# Patient Record
Sex: Male | Born: 1975 | Race: Black or African American | Hispanic: No | Marital: Single | Smoking: Current some day smoker
Health system: Southern US, Community
[De-identification: ages and names within clinical notes are randomized; demographics above are authoritative.]

## PROBLEM LIST (undated history)

## (undated) DIAGNOSIS — K5909 Other constipation: Secondary | ICD-10-CM

## (undated) DIAGNOSIS — F200 Paranoid schizophrenia: Secondary | ICD-10-CM

## (undated) DIAGNOSIS — F251 Schizoaffective disorder, depressive type: Secondary | ICD-10-CM

## (undated) DIAGNOSIS — J449 Chronic obstructive pulmonary disease, unspecified: Secondary | ICD-10-CM

## (undated) DIAGNOSIS — F32A Depression, unspecified: Secondary | ICD-10-CM

## (undated) DIAGNOSIS — J309 Allergic rhinitis, unspecified: Secondary | ICD-10-CM

## (undated) DIAGNOSIS — F331 Major depressive disorder, recurrent, moderate: Secondary | ICD-10-CM

## (undated) DIAGNOSIS — E785 Hyperlipidemia, unspecified: Secondary | ICD-10-CM

## (undated) DIAGNOSIS — F329 Major depressive disorder, single episode, unspecified: Secondary | ICD-10-CM

## (undated) DIAGNOSIS — K449 Diaphragmatic hernia without obstruction or gangrene: Secondary | ICD-10-CM

## (undated) DIAGNOSIS — I10 Essential (primary) hypertension: Secondary | ICD-10-CM

## (undated) DIAGNOSIS — M109 Gout, unspecified: Secondary | ICD-10-CM

## (undated) HISTORY — PX: APPENDECTOMY: SHX54

---
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
## 2013-08-02 ENCOUNTER — Emergency Department (INDEPENDENT_AMBULATORY_CARE_PROVIDER_SITE_OTHER): Payer: Medicare Other

## 2013-08-02 ENCOUNTER — Emergency Department (INDEPENDENT_AMBULATORY_CARE_PROVIDER_SITE_OTHER)
Admission: EM | Admit: 2013-08-02 | Discharge: 2013-08-02 | Disposition: A | Discharge status: Home / Self Care | Payer: Medicare Other | Source: Home / Self Care | Attending: Emergency Medicine | Admitting: Emergency Medicine

## 2013-08-02 ENCOUNTER — Encounter (HOSPITAL_COMMUNITY): Payer: Self-pay | Admitting: *Deleted

## 2013-08-02 DIAGNOSIS — M1310 Monoarthritis, not elsewhere classified, unspecified site: Secondary | ICD-10-CM

## 2013-08-02 HISTORY — DX: Depression, unspecified: F32.A

## 2013-08-02 HISTORY — DX: Major depressive disorder, single episode, unspecified: F32.9

## 2013-08-02 HISTORY — DX: Chronic obstructive pulmonary disease, unspecified: J44.9

## 2013-08-02 LAB — URIC ACID: Uric Acid, Serum: 10.5 mg/dL — ABNORMAL HIGH (ref 4.0–7.8)

## 2013-08-02 LAB — SYNOVIAL CELL COUNT + DIFF, W/ CRYSTALS: Eosinophils-Synovial: 0 % (ref 0–1)

## 2013-08-02 MED ORDER — METHYLPREDNISOLONE ACETATE 40 MG/ML IJ SUSP
INTRAMUSCULAR | Status: AC
  Filled 2013-08-02: qty 5

## 2013-08-02 MED ORDER — MELOXICAM 15 MG PO TABS: 15.0000 mg | ORAL_TABLET | Freq: Every day | ORAL | Status: DC

## 2013-08-02 NOTE — ED Notes (Signed)
Patient complains of swollen and aching left knee x 3 days; states pain when stepping forward; states taking tylenol and ibuprofen and knee has a numbness to now.

## 2013-08-02 NOTE — ED Provider Notes (Signed)
Chief Complaint:   Chief Complaint  Patient presents with  . Knee Pain    History of Present Illness:   Gerald Jackson is a 37 year old male who's had a three-day history of left knee pain. This is rated 8/10 in intensity, is localized to both sides of the knee and the knee is swollen. It hurts if she walks or even if he sits and better when he takes pain medication. He denies any injury to the knee. He's had no fever or chills. No other joint pain. No history of gout in the past.  Review of Systems:  Other than noted above, the patient denies any of the following symptoms: Systemic:  No fevers, chills, sweats, or aches.  No fatigue or tiredness. Musculoskeletal:  No joint pain, arthritis, bursitis, swelling, back pain, or neck pain.  Neurological:  No muscular weakness, paresthesias, headache, or trouble with speech or coordination.  No dizziness.  PMFSH:  Past medical history, family history, social history, meds, and allergies were reviewed.  No prior history of knee pain or arthritis.  He has COPD, depression, paranoid schizophrenia, and hypercholesterolemia. He takes Cogentin, Invega, Risperdal, and Zoloft.  Physical Exam:   Vital signs:  BP 116/83  Pulse 105  Temp(Src) 98.9 F (37.2 C) (Oral)  Resp 18  SpO2 96% Gen:  Alert and oriented times 3.  In no distress. Musculoskeletal: The left knee is markedly swollen with slight erythema, heat, and tenderness to palpation. Range of motion is limited and painful.   McMurray's test was negative.  Lachman's test was negative.  Anterior drawer test was negative.   Varus and valgus stress negative.  Otherwise, all joints had a full a ROM with no swelling, bruising or deformity.  No edema, pulses full. Extremities were warm and pink.  Capillary refill was brisk.  Skin:  Clear, warm and dry.  No rash. Neuro:  Alert and oriented times 3.  Muscle strength was normal.  Sensation was intact to light touch.   Results for orders placed during the  hospital encounter of 08/02/13  URIC ACID      Result Value Range   Uric Acid, Serum 10.5 (*) 4.0 - 7.8 mg/dL  CELL COUNT + DIFF,  W/ CRYST-SYNVL FLD      Result Value Range   Color, Synovial YELLOW (*) YELLOW   Appearance-Synovial CLOUDY (*) CLEAR   Crystals, Fluid INTRACELLULAR MONOSODIUM URATE CRYSTALS     WBC, Synovial 40981 (*) 0 - 200 /cu mm   Neutrophil, Synovial 96 (*) 0 - 25 %   Lymphocytes-Synovial Fld 1  0 - 20 %   Monocyte-Macrophage-Synovial Fluid 3 (*) 50 - 90 %   Eosinophils-Synovial 0  0 - 1 %   Radiology:  Dg Knee Complete 4 Views Left  08/02/2013   *RADIOLOGY REPORT*  Clinical Data: Pain and swelling  LEFT KNEE - COMPLETE 4+ VIEW  Comparison: None.  Findings: Frontal, lateral, and bilateral oblique views were obtained.  There is a large joint effusion.  No fracture or dislocation.  Joint spaces appear intact.  There is calcification along the inferior patellar tendons consistent with chronic patellar tendinosis.  IMPRESSION: Sizable joint effusion.  Evidence of chronic patellar tendinosis inferiorly.  No fracture or dislocation.   Original Report Authenticated By: Bretta Bang, M.D.   I reviewed the images independently and personally and concur with the radiologist's findings.   Procedure Note:  Verbal informed consent was obtained from the patient.  Risks and benefits were outlined with the  patient.  Patient understands and accepts these risks.  Identity of the patient was confirmed verbally and by armband.    Procedure was performed as follows:  The lateral aspect of the knee was prepped with Betadine and alcohol and anesthetized with 2% Xylocaine. Landmarks were identified an injection site was marked. The knee joint was entered with a 1/2 inch 22-gauge needle and 55 mL of thick, cloudy fluid was aspirated. Thereafter the knee was injected with 1 cc of Depo-Medrol 40 mg strength and 2 mL of 2% Xylocaine. The patient experienced relief thereafter. He was placed in a  knee sleeve.  Patient tolerated the procedure well without any immediate complications.  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  This is diagnostic for gout. He has intracellular monosodium urate crystals an elevated uric acid. The injection of steroid should provide him with relief. He's also got some meloxicam to take as well. I urged him to followup with her primary care physician to control his uric acid level.  Plan:   1.  The following meds were prescribed:   Discharge Medication List as of 08/02/2013  4:45 PM    START taking these medications   Details  meloxicam (MOBIC) 15 MG tablet Take 1 tablet (15 mg total) by mouth daily., Starting 08/02/2013, Until Discontinued, Normal       2.  The patient was instructed in symptomatic care, including rest and activity, elevation, application of ice and compression.  Appropriate handouts were given. 3.  The patient was told to return if becoming worse in any way, if no better in 3 or 4 days, and given some red flag symptoms such as any degree of fever that would indicate earlier return.   4.  The patient was told to follow up with Dr. Maryelizabeth Rowan as soon as possible.    Reuben Likes, MD 08/02/13 2012

## 2013-08-03 NOTE — ED Notes (Signed)
Synovial fluid cell count:  WBC's 23,600 H, neutrophils 96 H, intracellular monosodium urate crystals, lymph 1, mono's 3 L, eos 0.  8/16 Message sent to Dr. Lorenz Coaster.  No further action. He has already talked to the pt. about f/u.

## 2014-11-30 ENCOUNTER — Emergency Department (HOSPITAL_COMMUNITY)
Admission: EM | Admit: 2014-11-30 | Discharge: 2014-11-30 | Discharge status: Against Medical Advice | Payer: Medicare Other | Attending: Emergency Medicine | Admitting: Emergency Medicine

## 2014-11-30 ENCOUNTER — Encounter (HOSPITAL_COMMUNITY): Payer: Self-pay | Admitting: Emergency Medicine

## 2014-11-30 DIAGNOSIS — Z720 Tobacco use: Secondary | ICD-10-CM | POA: Diagnosis not present

## 2014-11-30 DIAGNOSIS — J449 Chronic obstructive pulmonary disease, unspecified: Secondary | ICD-10-CM | POA: Insufficient documentation

## 2014-11-30 DIAGNOSIS — R51 Headache: Secondary | ICD-10-CM | POA: Insufficient documentation

## 2014-11-30 NOTE — ED Notes (Signed)
Pt reports headache for 4 hours that is making his mouth hurt. Pt took 2 tylenol at home with no relief. Hx migraines.

## 2014-11-30 NOTE — ED Notes (Signed)
Pt called for repeat vital signs x2. No answer.

## 2014-11-30 NOTE — ED Notes (Signed)
Unable to locate x3 when called in lobby for VS re-assessment and then for room

## 2015-06-03 ENCOUNTER — Emergency Department (INDEPENDENT_AMBULATORY_CARE_PROVIDER_SITE_OTHER): Payer: Medicare Other

## 2015-06-03 ENCOUNTER — Encounter (HOSPITAL_COMMUNITY): Payer: Self-pay | Admitting: Emergency Medicine

## 2015-06-03 ENCOUNTER — Emergency Department (INDEPENDENT_AMBULATORY_CARE_PROVIDER_SITE_OTHER)
Admission: EM | Admit: 2015-06-03 | Discharge: 2015-06-03 | Disposition: A | Discharge status: Home / Self Care | Payer: Medicare Other | Source: Home / Self Care | Attending: Family Medicine | Admitting: Family Medicine

## 2015-06-03 DIAGNOSIS — M10061 Idiopathic gout, right knee: Secondary | ICD-10-CM

## 2015-06-03 MED ORDER — COLCHICINE 0.6 MG PO TABS
0.6000 mg | ORAL_TABLET | Freq: Every day | ORAL | Status: DC
Start: 2015-06-03 — End: 2016-04-18

## 2015-06-03 MED ORDER — HYDROCODONE-ACETAMINOPHEN 5-325 MG PO TABS: 1.0000 | ORAL_TABLET | Freq: Four times a day (QID) | ORAL | Status: DC | PRN

## 2015-06-03 NOTE — Discharge Instructions (Signed)
Thank you for coming in today. Follow up with your doctor.    Gout Gout is an inflammatory arthritis caused by a buildup of uric acid crystals in the joints. Uric acid is a chemical that is normally present in the blood. When the level of uric acid in the blood is too high it can form crystals that deposit in your joints and tissues. This causes joint redness, soreness, and swelling (inflammation). Repeat attacks are common. Over time, uric acid crystals can form into masses (tophi) near a joint, destroying bone and causing disfigurement. Gout is treatable and often preventable. CAUSES  The disease begins with elevated levels of uric acid in the blood. Uric acid is produced by your body when it breaks down a naturally found substance called purines. Certain foods you eat, such as meats and fish, contain high amounts of purines. Causes of an elevated uric acid level include:  Being passed down from parent to child (heredity).  Diseases that cause increased uric acid production (such as obesity, psoriasis, and certain cancers).  Excessive alcohol use.  Diet, especially diets rich in meat and seafood.  Medicines, including certain cancer-fighting medicines (chemotherapy), water pills (diuretics), and aspirin.  Chronic kidney disease. The kidneys are no longer able to remove uric acid well.  Problems with metabolism. Conditions strongly associated with gout include:  Obesity.  High blood pressure.  High cholesterol.  Diabetes. Not everyone with elevated uric acid levels gets gout. It is not understood why some people get gout and others do not. Surgery, joint injury, and eating too much of certain foods are some of the factors that can lead to gout attacks. SYMPTOMS   An attack of gout comes on quickly. It causes intense pain with redness, swelling, and warmth in a joint.  Fever can occur.  Often, only one joint is involved. Certain joints are more commonly involved:  Base of the  big toe.  Knee.  Ankle.  Wrist.  Finger. Without treatment, an attack usually goes away in a few days to weeks. Between attacks, you usually will not have symptoms, which is different from many other forms of arthritis. DIAGNOSIS  Your caregiver will suspect gout based on your symptoms and exam. In some cases, tests may be recommended. The tests may include:  Blood tests.  Urine tests.  X-rays.  Joint fluid exam. This exam requires a needle to remove fluid from the joint (arthrocentesis). Using a microscope, gout is confirmed when uric acid crystals are seen in the joint fluid. TREATMENT  There are two phases to gout treatment: treating the sudden onset (acute) attack and preventing attacks (prophylaxis).  Treatment of an Acute Attack.  Medicines are used. These include anti-inflammatory medicines or steroid medicines.  An injection of steroid medicine into the affected joint is sometimes necessary.  The painful joint is rested. Movement can worsen the arthritis.  You may use warm or cold treatments on painful joints, depending which works best for you.  Treatment to Prevent Attacks.  If you suffer from frequent gout attacks, your caregiver may advise preventive medicine. These medicines are started after the acute attack subsides. These medicines either help your kidneys eliminate uric acid from your body or decrease your uric acid production. You may need to stay on these medicines for a very long time.  The early phase of treatment with preventive medicine can be associated with an increase in acute gout attacks. For this reason, during the first few months of treatment, your caregiver may also  advise you to take medicines usually used for acute gout treatment. Be sure you understand your caregiver's directions. Your caregiver may make several adjustments to your medicine dose before these medicines are effective.  Discuss dietary treatment with your caregiver or dietitian.  Alcohol and drinks high in sugar and fructose and foods such as meat, poultry, and seafood can increase uric acid levels. Your caregiver or dietitian can advise you on drinks and foods that should be limited. HOME CARE INSTRUCTIONS   Do not take aspirin to relieve pain. This raises uric acid levels.  Only take over-the-counter or prescription medicines for pain, discomfort, or fever as directed by your caregiver.  Rest the joint as much as possible. When in bed, keep sheets and blankets off painful areas.  Keep the affected joint raised (elevated).  Apply warm or cold treatments to painful joints. Use of warm or cold treatments depends on which works best for you.  Use crutches if the painful joint is in your leg.  Drink enough fluids to keep your urine clear or pale yellow. This helps your body get rid of uric acid. Limit alcohol, sugary drinks, and fructose drinks.  Follow your dietary instructions. Pay careful attention to the amount of protein you eat. Your daily diet should emphasize fruits, vegetables, whole grains, and fat-free or low-fat milk products. Discuss the use of coffee, vitamin C, and cherries with your caregiver or dietitian. These may be helpful in lowering uric acid levels.  Maintain a healthy body weight. SEEK MEDICAL CARE IF:   You develop diarrhea, vomiting, or any side effects from medicines.  You do not feel better in 24 hours, or you are getting worse. SEEK IMMEDIATE MEDICAL CARE IF:   Your joint becomes suddenly more tender, and you have chills or a fever. MAKE SURE YOU:   Understand these instructions.  Will watch your condition.  Will get help right away if you are not doing well or get worse. Document Released: 12/01/2000 Document Revised: 04/20/2014 Document Reviewed: 07/17/2012 [facility] Patient Information 2015 [place], [place]. This information is not intended to replace advice given to you by your health care provider. Make sure you discuss any  questions you have with your health care provider.

## 2015-06-03 NOTE — ED Notes (Signed)
C/o right knee pain for two days States knee is swollen  Denies any injury to knee

## 2015-06-03 NOTE — ED Provider Notes (Signed)
Gerald Jackson is a 39 y.o. male who presents to Urgent Care today for right knee pain and left foot pain. Patient notes a 2 day history of right knee pain and effusion and mild left bursal foot pain. He denies any injury but notes the pain happened after he moved some furniture. He's had similar knee pain and swelling in the past and his left knee attributed to gout. He denies any fevers or chills nausea vomiting or diarrhea. He has tried an Ace wrap on his knee but no medications yet. He feels well otherwise.   History reviewed. No pertinent past medical history. No past surgical history on file. History  Substance Use Topics  . Smoking status: Not on file  . Smokeless tobacco: Not on file  . Alcohol Use: Not on file   ROS as above Medications: No current facility-administered medications for this encounter.   Current Outpatient Prescriptions  Medication Sig Dispense Refill  . colchicine 0.6 MG tablet Take 1 tablet (0.6 mg total) by mouth daily. 30 tablet 0  . HYDROcodone-acetaminophen (NORCO/VICODIN) 5-325 MG per tablet Take 1 tablet by mouth every 6 (six) hours as needed. 10 tablet 0   No Known Allergies   Exam:  BP 136/97 mmHg  Pulse 102  Temp(Src) 98.6 F (37 C) (Oral)  Resp 16  SpO2 95% Gen: Well NAD HEENT: EOMI,  MMM Lungs: Normal work of breathing. CTABL Heart: Mild tachycardia no MRG Abd: NABS, Soft. Nondistended, Nontender Exts: Brisk capillary refill, warm and well perfused.  Right knee: Moderate effusion present. Nontender normal motion stable ligamentous exam. Left foot normal-appearing no significant swelling. Tender palpation dorsal midfoot. Pulses capillary refill and sensation intact.  No results found for this or any previous visit (from the past 24 hour(s)). Dg Knee Complete 4 Views Right  06/03/2015   CLINICAL DATA:  Recent twisting injury with persistent pain laterally  EXAM: RIGHT KNEE - COMPLETE 4+ VIEW  COMPARISON:  None.  FINDINGS: No acute fracture or  dislocation is noted. A small joint effusion is seen. No other soft tissue abnormality is noted.  IMPRESSION: Small joint effusion without acute bony abnormality.   Electronically Signed   By: Alcide Clever M.D.   On: 06/03/2015 16:03    Assessment and Plan: 39 y.o. male with  1) right knee pain with mild effusion. No injury. He has a history of gout. I suspect his current pain is due to a gout flare. Treat with colchicine and Norco. Follow-up with PCP for further evaluation and management. He may benefit from chronic gout management with uric acid lowering. 2) foot pain I also believe is due to a gout flare. Same management.  Discussed warning signs or symptoms. Please see discharge instructions. Patient expresses understanding.     Rodolph Bong, MD 06/03/15 [phone]

## 2015-07-30 ENCOUNTER — Encounter (HOSPITAL_COMMUNITY): Payer: Self-pay | Admitting: Emergency Medicine

## 2016-04-18 ENCOUNTER — Emergency Department (HOSPITAL_COMMUNITY)
Admission: EM | Admit: 2016-04-18 | Discharge: 2016-04-18 | Disposition: A | Discharge status: Home / Self Care | Payer: Medicare Other | Attending: Emergency Medicine | Admitting: Emergency Medicine

## 2016-04-18 ENCOUNTER — Emergency Department (HOSPITAL_COMMUNITY): Payer: Medicare Other

## 2016-04-18 ENCOUNTER — Encounter (HOSPITAL_COMMUNITY): Payer: Self-pay

## 2016-04-18 DIAGNOSIS — E663 Overweight: Secondary | ICD-10-CM | POA: Diagnosis not present

## 2016-04-18 DIAGNOSIS — M542 Cervicalgia: Secondary | ICD-10-CM | POA: Diagnosis not present

## 2016-04-18 DIAGNOSIS — R05 Cough: Secondary | ICD-10-CM | POA: Diagnosis not present

## 2016-04-18 DIAGNOSIS — F32A Depression, unspecified: Secondary | ICD-10-CM

## 2016-04-18 DIAGNOSIS — R0789 Other chest pain: Secondary | ICD-10-CM | POA: Insufficient documentation

## 2016-04-18 DIAGNOSIS — J449 Chronic obstructive pulmonary disease, unspecified: Secondary | ICD-10-CM | POA: Diagnosis not present

## 2016-04-18 DIAGNOSIS — F17210 Nicotine dependence, cigarettes, uncomplicated: Secondary | ICD-10-CM | POA: Insufficient documentation

## 2016-04-18 DIAGNOSIS — F329 Major depressive disorder, single episode, unspecified: Secondary | ICD-10-CM | POA: Diagnosis not present

## 2016-04-18 DIAGNOSIS — Z79899 Other long term (current) drug therapy: Secondary | ICD-10-CM | POA: Insufficient documentation

## 2016-04-18 DIAGNOSIS — R079 Chest pain, unspecified: Secondary | ICD-10-CM | POA: Diagnosis present

## 2016-04-18 LAB — I-STAT TROPONIN, ED
Troponin i, poc: 0 ng/mL (ref 0.00–0.08)
Troponin i, poc: 0 ng/mL (ref 0.00–0.08)

## 2016-04-18 LAB — BASIC METABOLIC PANEL
ANION GAP: 11 (ref 5–15)
BUN: 7 mg/dL (ref 6–20)
CHLORIDE: 103 mmol/L (ref 101–111)
CO2: 26 mmol/L (ref 22–32)
Calcium: 9.5 mg/dL (ref 8.9–10.3)
Creatinine, Ser: 1.3 mg/dL — ABNORMAL HIGH (ref 0.61–1.24)
GFR calc Af Amer: >60 mL/min (ref >60)
GFR calc non Af Amer: >60 mL/min (ref >60)
GLUCOSE: 166 mg/dL — AB (ref 65–99)
POTASSIUM: 4.2 mmol/L (ref 3.5–5.1)
Sodium: 140 mmol/L (ref 135–145)

## 2016-04-18 LAB — CBC
HEMATOCRIT: 44.7 % (ref 39.0–52.0)
HEMOGLOBIN: 15.2 g/dL (ref 13.0–17.0)
MCH: 29 pg (ref 26.0–34.0)
MCHC: 34 g/dL (ref 30.0–36.0)
MCV: 85.3 fL (ref 78.0–100.0)
Platelets: 165 10*3/uL (ref 150–400)
RBC: 5.24 MIL/uL (ref 4.22–5.81)
RDW: 13.1 % (ref 11.5–15.5)
WBC: 11.7 10*3/uL — ABNORMAL HIGH (ref 4.0–10.5)

## 2016-04-18 NOTE — ED Provider Notes (Signed)
CSN: [id number]     Arrival date & time 04/18/16  0319 History  By signing my name below, I, Bethel Born, attest that this documentation has been prepared under the direction and in the presence of Shon Baton, MD. Electronically Signed: Bethel Born, ED Scribe. 04/18/2016. 4:03 AM    Chief Complaint  Patient presents with  . Chest Pain  . Suicidal    The history is provided by the patient. No language interpreter was used.   Brought in by EMS from [facility], Gerald Jackson is a 40 y.o. male with PMHx of COPD and depression who presents to the Emergency Department complaining of intermittent chest pain with onset 4 years ago. He is pain free at present. Associated symptoms include cough, neck pain, and shoulder pain. Pt denies fever.  Pt reports that "sometimes" he feels like hurting his roommate. He does not have a plan on how he would harm the roommate. Pt denies SI. Denies alcohol or drug use.  Patient has a history of schizophrenia. Endorse suicidal ideation to the nurse prior to arrival. Also reported to the center that "he was hearing voices." He denies this to me. Unclear whether he is taking medications.  Past Medical History  Diagnosis Date  . COPD (chronic obstructive pulmonary disease) (HCC)   . Depression    History reviewed. No pertinent past surgical history. History reviewed. No pertinent family history. Social History  Substance Use Topics  . Smoking status: Current Some Day Smoker -- 0.50 packs/day    Types: Cigarettes  . Smokeless tobacco: None  . Alcohol Use: Yes     Comment: occasionally    Review of Systems  Respiratory: Positive for cough.   Cardiovascular: Positive for chest pain.  Musculoskeletal: Positive for arthralgias and neck pain.  Psychiatric/Behavioral: Negative for suicidal ideas.  All other systems reviewed and are negative.  Allergies  Review of patient's allergies indicates no known allergies.  Home  Medications   Prior to Admission medications   Medication Sig Start Date End Date Taking? Authorizing Provider  benztropine (COGENTIN) 1 MG tablet Take 2 mg by mouth 2 (two) times daily.    Yes Historical Provider, MD  omeprazole (PRILOSEC) 40 MG capsule Take 40 mg by mouth daily.   Yes Historical Provider, MD  Paliperidone Palmitate (INVEGA TRINZA) 819 MG/2.625ML SUSP Inject 1 Applicatorful into the muscle every 3 (three) months.   Yes Historical Provider, MD  sertraline (ZOLOFT) 100 MG tablet Take 100 mg by mouth daily.   Yes Historical Provider, MD  traZODone (DESYREL) 150 MG tablet Take 150 mg by mouth at bedtime.   Yes Historical Provider, MD   Pulse 87  Temp(Src) 97.8 F (36.6 C) (Oral)  Resp 23  Ht  (1.93 m)  Wt 232 lb (105.235 kg)  BMI 28.25 kg/m2  SpO2 98% Physical Exam  Constitutional: He is oriented to person, place, and time. He appears well-developed and well-nourished.  Overweight  HENT:  Head: Normocephalic and atraumatic.  Cardiovascular: Normal rate, regular rhythm and normal heart sounds.   No murmur heard. Pulmonary/Chest: Effort normal and breath sounds normal. No respiratory distress. He has no wheezes. He exhibits no tenderness.  Abdominal: Soft. Bowel sounds are normal. There is no tenderness. There is no rebound.  Musculoskeletal: He exhibits no edema.  Neurological: He is alert and oriented to person, place, and time.  Skin: Skin is warm and dry.  Psychiatric:  Bizarre affect, loses train of thought easily  Nursing  note and vitals reviewed.   ED Course  Procedures (including critical care time) DIAGNOSTIC STUDIES: Oxygen Saturation is 98% on RA,  normal by my interpretation.    COORDINATION OF CARE: 4:02 AM Discussed treatment plan which includes lab work, CXR, EKG with pt at bedside and pt agreed to plan.  Labs Review Labs Reviewed  BASIC METABOLIC PANEL - Abnormal; Notable for the following:    Glucose, Bld 166 (*)    Creatinine, Ser 1.30  (*)    All other components within normal limits  CBC - Abnormal; Notable for the following:    WBC 11.7 (*)    All other components within normal limits  I-STAT TROPOININ, ED    Imaging Review Dg Chest 2 View  04/18/2016  CLINICAL DATA:  Chest pain for 5 minutes. Resolved with nitroglycerin. EXAM: CHEST  2 VIEW COMPARISON:  None. FINDINGS: Shallow inspiration. The heart size and mediastinal contours are within normal limits. Both lungs are clear. The visualized skeletal structures are unremarkable. IMPRESSION: No active cardiopulmonary disease. Electronically Signed   By: Burman NievesWilliam  Stevens M.D.   On: 04/18/2016 04:25   I have personally reviewed and evaluated these images and lab results as part of my medical decision-making.   EKG Interpretation   Date/Time:  Tuesday Apr 18 2016 03:26:31 EDT Ventricular Rate:  86 PR Interval:  174 QRS Duration: 85 QT Interval:  367 QTC Calculation: 439 R Axis:   51 Text Interpretation:  Sinus rhythm Borderline T wave abnormalities  Confirmed by Veryl Winemiller  MD, Saskia Simerson ([id number]) on 04/18/2016 4:25:29 AM      MDM   Final diagnoses:  None    Patient presents with chest pain. Reports chest pain for "four years."  Otherwise has expressed suicidal ideation and hallucinations to nursing. Denies this to me. EKG is reassuring. Chest x-ray and lab work is also reassuring. We'll have TTS evaluate given reports to nursing..  I personally performed the services described in this documentation, which was scribed in my presence. The recorded information has been reviewed and is accurate.    Shon Batonourtney F Harumi Yamin, MD 04/18/16 [phone]

## 2016-04-18 NOTE — Progress Notes (Signed)
Spoke with Onalee Huaavid, Production designer, theatre/television/filmmanager at pt's [facility] [phone]. Pt is his own legal guardian and has lived there since 10/2016. Mr. Onalee HuaDavid states that pt "has never mentioned hurting himself or anyone else and has no hx of self-harm or aggression since he has lived with us. There is limited hx about his treatment prior to moving here, but we have no concerns about his safety here." Pt received outpatient psychiatric services with Dr. Nolon Rodarol Gibbs and is dx with schizophrenia and is prescribed Invega IM, last administration 2 weeks ago. States medication regimen seems effective to manage pt's symptoms and no recent med changes have been made.  Mr. Onalee HuaDavid states that he can be contacted at above number when disposition has been rendered for pt.  Relayed information to psych team.  Ilean SkillMeghan Reshma Hoey, MSW, LCSW Clinical Social Work, Disposition  04/18/2016 [phone]

## 2016-04-18 NOTE — Discharge Instructions (Signed)
Nonspecific Chest Pain  °Chest pain can be caused by many different conditions. There is always a chance that your pain could be related to something serious, such as a heart attack or a blood clot in your lungs. Chest pain can also be caused by conditions that are not life-threatening. If you have chest pain, it is very important to follow up with your health care provider. °CAUSES  °Chest pain can be caused by: °· Heartburn. °· Pneumonia or bronchitis. °· Anxiety or stress. °· Inflammation around your heart (pericarditis) or lung (pleuritis or pleurisy). °· A blood clot in your lung. °· A collapsed lung (pneumothorax). It can develop suddenly on its own (spontaneous pneumothorax) or from trauma to the chest. °· Shingles infection (varicella-zoster virus). °· Heart attack. °· Damage to the bones, muscles, and cartilage that make up your chest wall. This can include: °¨ Bruised bones due to injury. °¨ Strained muscles or cartilage due to frequent or repeated coughing or overwork. °¨ Fracture to one or more ribs. °¨ Sore cartilage due to inflammation (costochondritis). °RISK FACTORS  °Risk factors for chest pain may include: °· Activities that increase your risk for trauma or injury to your chest. °· Respiratory infections or conditions that cause frequent coughing. °· Medical conditions or overeating that can cause heartburn. °· Heart disease or family history of heart disease. °· Conditions or health behaviors that increase your risk of developing a blood clot. °· Having had chicken pox (varicella zoster). °SIGNS AND SYMPTOMS °Chest pain can feel like: °· Burning or tingling on the surface of your chest or deep in your chest. °· Crushing, pressure, aching, or squeezing pain. °· Dull or sharp pain that is worse when you move, cough, or take a deep breath. °· Pain that is also felt in your back, neck, shoulder, or arm, or pain that spreads to any of these areas. °Your chest pain may come and go, or it may stay  constant. °DIAGNOSIS °Lab tests or other studies may be needed to find the cause of your pain. Your health care provider may have you take a test called an ambulatory ECG (electrocardiogram). An ECG records your heartbeat patterns at the time the test is performed. You may also have other tests, such as: °· Transthoracic echocardiogram (TTE). During echocardiography, sound waves are used to create a picture of all of the heart structures and to look at how blood flows through your heart. °· Transesophageal echocardiogram (TEE). This is a more advanced imaging test that obtains images from inside your body. It allows your health care provider to see your heart in finer detail. °· Cardiac monitoring. This allows your health care provider to monitor your heart rate and rhythm in real time. °· Holter monitor. This is a portable device that records your heartbeat and can help to diagnose abnormal heartbeats. It allows your health care provider to track your heart activity for several days, if needed. °· Stress tests. These can be done through exercise or by taking medicine that makes your heart beat more quickly. °· Blood tests. °· Imaging tests. °TREATMENT  °Your treatment depends on what is causing your chest pain. Treatment may include: °· Medicines. These may include: °¨ Acid blockers for heartburn. °¨ Anti-inflammatory medicine. °¨ Pain medicine for inflammatory conditions. °¨ Antibiotic medicine, if an infection is present. °¨ Medicines to dissolve blood clots. °¨ Medicines to treat coronary artery disease. °· Supportive care for conditions that do not require medicines. This may include: °¨ Resting. °¨ Applying heat   or cold packs to injured areas. °¨ Limiting activities until pain decreases. °HOME CARE INSTRUCTIONS °· If you were prescribed an antibiotic medicine, finish it all even if you start to feel better. °· Avoid any activities that bring on chest pain. °· Do not use any tobacco products, including  cigarettes, chewing tobacco, or electronic cigarettes. If you need help quitting, ask your health care provider. °· Do not drink alcohol. °· Take medicines only as directed by your health care provider. °· Keep all follow-up visits as directed by your health care provider. This is important. This includes any further testing if your chest pain does not go away. °· If heartburn is the cause for your chest pain, you may be told to keep your head raised (elevated) while sleeping. This reduces the chance that acid will go from your stomach into your esophagus. °· Make lifestyle changes as directed by your health care provider. These may include: °¨ Getting regular exercise. Ask your health care provider to suggest some activities that are safe for you. °¨ Eating a heart-healthy diet. A registered dietitian can help you to learn healthy eating options. °¨ Maintaining a healthy weight. °¨ Managing diabetes, if necessary. °¨ Reducing stress. °SEEK MEDICAL CARE IF: °· Your chest pain does not go away after treatment. °· You have a rash with blisters on your chest. °· You have a fever. °SEEK IMMEDIATE MEDICAL CARE IF:  °· Your chest pain is worse. °· You have an increasing cough, or you cough up blood. °· You have severe abdominal pain. °· You have severe weakness. °· You faint. °· You have chills. °· You have sudden, unexplained chest discomfort. °· You have sudden, unexplained discomfort in your arms, back, neck, or jaw. °· You have shortness of breath at any time. °· You suddenly start to sweat, or your skin gets clammy. °· You feel nauseous or you vomit. °· You suddenly feel light-headed or dizzy. °· Your heart begins to beat quickly, or it feels like it is skipping beats. °These symptoms may represent a serious problem that is an emergency. Do not wait to see if the symptoms will go away. Get medical help right away. Call your local emergency services (911 in the [place] Do not drive yourself to the hospital. °  °This  information is not intended to replace advice given to you by your health care provider. Make sure you discuss any questions you have with your health care provider. °  °Document Released: 09/13/2005 Document Revised: 12/25/2014 Document Reviewed: 07/10/2014 °Elsevier Interactive Patient Education ©2016 [employer] ° °
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
## 2016-04-18 NOTE — ED Notes (Signed)
[facility] for Chest Pain. Pt complains of 10-10 pain with EMS they gave 324mg  ASA and 1 nitro and pt is now chest pain free.

## 2016-04-18 NOTE — ED Notes (Addendum)
Pt said he could not remember his name or his birthday. He said "I don't think this name and date on my wristband is me." Pt states he is schizophrenic. Pt said "I see their figures and shadows. I know they are there, but when I turn to look at them, they are gone." I asked the patient if they speak to him or if he hears voices. Pt said yes, but would not repeat what the voices were telling him. Rn notified.

## 2016-04-18 NOTE — ED Notes (Signed)
Pt transported to xray 

## 2016-04-18 NOTE — ED Notes (Signed)
[facility] lives at [facility]. Breakfast tray given.

## 2016-04-18 NOTE — ED Provider Notes (Signed)
Patient seen by TTS. He does not meet inpatient criteria. Group home feels comfortable taking him back. He denies any suicidal or homicidal thoughts. Chest pain has resolved. Troponin negative 2.  BP 121/84 mmHg  Pulse 77  Temp(Src) 97.8 F (36.6 C) (Oral)  Resp 18  Ht 6\' 4"  (1.93 m)  Wt 232 lb (105.235 kg)  BMI 28.25 kg/m2  SpO2 97%   Glynn OctaveStephen Tiphani Mells, MD 04/18/16 1523

## 2016-04-18 NOTE — BH Assessment (Signed)
Per Vernona RiegerLaura, NP - patient does not meet criteria for inpatient hospitalization.  Writer informed the ER MD (Dr. Manus Gunningancour) of the patients disposition.   Writer left a voice mail massage for the staff at [facility] that the patient is ready to be picked up.    Patient will be able to follow up with his established outpatient mental health providers.

## 2018-11-12 ENCOUNTER — Encounter (HOSPITAL_COMMUNITY): Payer: Self-pay

## 2018-11-12 ENCOUNTER — Inpatient Hospital Stay (HOSPITAL_COMMUNITY)
Admission: EM | Admit: 2018-11-12 | Discharge: 2018-11-14 | DRG: 390 | Disposition: A | Discharge status: Home / Self Care | Payer: Medicare Other | Attending: Internal Medicine | Admitting: Internal Medicine

## 2018-11-12 ENCOUNTER — Other Ambulatory Visit: Payer: Self-pay

## 2018-11-12 ENCOUNTER — Emergency Department (HOSPITAL_COMMUNITY): Payer: Medicare Other

## 2018-11-12 DIAGNOSIS — K56609 Unspecified intestinal obstruction, unspecified as to partial versus complete obstruction: Principal | ICD-10-CM | POA: Diagnosis present

## 2018-11-12 DIAGNOSIS — M109 Gout, unspecified: Secondary | ICD-10-CM | POA: Diagnosis present

## 2018-11-12 DIAGNOSIS — F17210 Nicotine dependence, cigarettes, uncomplicated: Secondary | ICD-10-CM | POA: Diagnosis present

## 2018-11-12 DIAGNOSIS — J449 Chronic obstructive pulmonary disease, unspecified: Secondary | ICD-10-CM | POA: Diagnosis not present

## 2018-11-12 DIAGNOSIS — I10 Essential (primary) hypertension: Secondary | ICD-10-CM

## 2018-11-12 DIAGNOSIS — E785 Hyperlipidemia, unspecified: Secondary | ICD-10-CM

## 2018-11-12 DIAGNOSIS — Z79899 Other long term (current) drug therapy: Secondary | ICD-10-CM | POA: Diagnosis not present

## 2018-11-12 DIAGNOSIS — F329 Major depressive disorder, single episode, unspecified: Secondary | ICD-10-CM | POA: Diagnosis present

## 2018-11-12 DIAGNOSIS — F32A Depression, unspecified: Secondary | ICD-10-CM | POA: Diagnosis present

## 2018-11-12 HISTORY — DX: Hyperlipidemia, unspecified: E78.5

## 2018-11-12 HISTORY — DX: Gout, unspecified: M10.9

## 2018-11-12 HISTORY — DX: Essential (primary) hypertension: I10

## 2018-11-12 LAB — URINALYSIS, ROUTINE W REFLEX MICROSCOPIC
BILIRUBIN URINE: NEGATIVE
Glucose, UA: NEGATIVE mg/dL
Hgb urine dipstick: NEGATIVE
KETONES UR: NEGATIVE mg/dL
LEUKOCYTES UA: NEGATIVE
NITRITE: NEGATIVE
PROTEIN: NEGATIVE mg/dL
Specific Gravity, Urine: 1.008 (ref 1.005–1.030)
pH: 6 (ref 5.0–8.0)

## 2018-11-12 LAB — CBC WITH DIFFERENTIAL/PLATELET
Abs Immature Granulocytes: 0.05 10*3/uL (ref 0.00–0.07)
Basophils Absolute: 0 10*3/uL (ref 0.0–0.1)
Basophils Relative: 0 %
EOS ABS: 0.1 10*3/uL (ref 0.0–0.5)
EOS PCT: 1 %
HEMATOCRIT: 43.9 % (ref 39.0–52.0)
Hemoglobin: 14.7 g/dL (ref 13.0–17.0)
IMMATURE GRANULOCYTES: 0 %
LYMPHS ABS: 2.1 10*3/uL (ref 0.7–4.0)
LYMPHS PCT: 17 %
MCH: 30 pg (ref 26.0–34.0)
MCHC: 33.5 g/dL (ref 30.0–36.0)
MCV: 89.6 fL (ref 80.0–100.0)
Monocytes Absolute: 0.7 10*3/uL (ref 0.1–1.0)
Monocytes Relative: 6 %
NEUTROS PCT: 76 %
NRBC: 0 % (ref 0.0–0.2)
Neutro Abs: 9.2 10*3/uL — ABNORMAL HIGH (ref 1.7–7.7)
Platelets: 182 10*3/uL (ref 150–400)
RBC: 4.9 MIL/uL (ref 4.22–5.81)
RDW: 12.3 % (ref 11.5–15.5)
WBC: 12.2 10*3/uL — AB (ref 4.0–10.5)

## 2018-11-12 LAB — COMPREHENSIVE METABOLIC PANEL
ALBUMIN: 4.4 g/dL (ref 3.5–5.0)
ALK PHOS: 38 U/L (ref 38–126)
ALT: 25 U/L (ref 0–44)
AST: 49 U/L — ABNORMAL HIGH (ref 15–41)
Anion gap: 8 (ref 5–15)
BUN: 6 mg/dL (ref 6–20)
CALCIUM: 9.1 mg/dL (ref 8.9–10.3)
CO2: 26 mmol/L (ref 22–32)
Chloride: 107 mmol/L (ref 98–111)
Creatinine, Ser: 0.92 mg/dL (ref 0.61–1.24)
GFR calc non Af Amer: >60 mL/min (ref >60)
GLUCOSE: 99 mg/dL (ref 70–99)
POTASSIUM: 3.8 mmol/L (ref 3.5–5.1)
SODIUM: 141 mmol/L (ref 135–145)
TOTAL PROTEIN: 7.1 g/dL (ref 6.5–8.1)
Total Bilirubin: 0.8 mg/dL (ref 0.3–1.2)

## 2018-11-12 LAB — LIPASE, BLOOD: Lipase: 20 U/L (ref 11–51)

## 2018-11-12 MED ORDER — SODIUM CHLORIDE 0.9 % IV BOLUS
1000.0000 mL | Freq: Once | INTRAVENOUS | Status: AC
  Administered 2018-11-12: 1000 mL via INTRAVENOUS

## 2018-11-12 MED ORDER — ONDANSETRON HCL 4 MG/2ML IJ SOLN
4.0000 mg | Freq: Once | INTRAMUSCULAR | Status: AC
  Administered 2018-11-12: 4 mg via INTRAVENOUS
  Filled 2018-11-12: qty 2

## 2018-11-12 MED ORDER — IOPAMIDOL (ISOVUE-300) INJECTION 61%
100.0000 mL | Freq: Once | INTRAVENOUS | Status: AC | PRN
  Administered 2018-11-12: 100 mL via INTRAVENOUS

## 2018-11-12 MED ORDER — IOPAMIDOL (ISOVUE-300) INJECTION 61%
INTRAVENOUS | Status: AC
  Filled 2018-11-12: qty 100

## 2018-11-12 MED ORDER — SODIUM CHLORIDE (PF) 0.9 % IJ SOLN
INTRAMUSCULAR | Status: AC
  Administered 2018-11-12: 23:00:00
  Filled 2018-11-12: qty 50

## 2018-11-12 MED ORDER — POTASSIUM CHLORIDE IN NACL 20-0.9 MEQ/L-% IV SOLN
INTRAVENOUS | Status: DC
  Administered 2018-11-13 – 2018-11-14 (×2): via INTRAVENOUS
  Filled 2018-11-12 (×5): qty 1000

## 2018-11-12 MED ORDER — SODIUM CHLORIDE 0.9 % IV SOLN
INTRAVENOUS | Status: DC
  Administered 2018-11-12: 23:00:00 via INTRAVENOUS

## 2018-11-12 NOTE — ED Notes (Signed)
Bed: [id number] Expected date:  Expected time:  Means of arrival:  Comments: 42 yr old abd pain

## 2018-11-12 NOTE — H&P (Signed)
History and Physical    Gerald Jackson ZOX:[id number] DOB: 08/24/1976 DOA: 11/12/2018  PCP: Gerald Moccasinewey, Elizabeth R, MD   Patient coming from: Home.  I have personally briefly reviewed patient[facility]  Chief Complaint: Abdominal pain.  HPI: Gerald Jackson is a 42 y.o. male with medical history significant of COPD, depression, gout, hyperlipidemia, hypertension who is coming to the emergency department with complaints of abdominal pain since early afternoon associated with one episode of emesis which relief his abdominal pain significantly.  He denies melena or hematochezia, but states that he has been constipated for at least 5 days.  He denies fever, chills, sore throat, rhinorrhea, wheezing, hemoptysis, chest pain, palpitations, dizziness, diaphoresis, PND, orthopnea or pitting edema of the lower extremities.  No dysuria, frequency or hematuria.  Denies polyuria, polydipsia, polyphagia or blurred vision.  ED Course: Initial vital signs temperature 98.9 F, pulse 77, respirations 16, blood pressure 105/73 mmHg and O2 sat 96% on room air.  The patient received 1000 mL NS bolus and Zofran 4 mg IVP.  His urinalysis was normal.  CBC had a white count of 12.2 with 76% neutrophils, 17% lymphocytes and 6% monocytes.  Hemoglobin was 14.7 g/dL and platelets [id number].  His CMP showed an AST of 49 units/L, all other values are within normal limits.  Lipase was normal.  Magnesium and phosphorus were normal.  Imaging: Abdominal x-ray showed few mildly dilated gas loops of small bowel within the upper and mid left abdomen with associated air-fluid levels concerning for small bowel obstruction.  Moderate to large stool burden throughout the colon suggesting constipation.  This was confirmed with CT abdomen/pelvis with contrast.  Please images and full radiology report for further detail.  Review of Systems: As per HPI otherwise 10 point review of systems negative.   Past Medical  History:  Diagnosis Date  . COPD (chronic obstructive pulmonary disease) (HCC)   . Depression   . Gout 11/12/2018  . Hyperlipidemia 11/12/2018  . Hypertension 11/12/2018    Past Surgical History:  Procedure Laterality Date  . APPENDECTOMY       reports that he has been smoking cigarettes. He has been smoking about 0.50 packs per day. He has never used smokeless tobacco. He reports that he drinks alcohol. He reports that he has current or past drug history. Drug: Marijuana.  No Known Allergies  Family medical history Mother hypertension   Prior to Admission medications   Medication Sig Start Date End Date Taking? Authorizing Provider  acetaminophen (TYLENOL) 325 MG tablet Take 650 mg by mouth every 4 (four) hours as needed for mild pain or headache.   Yes [provider]  allopurinol (ZYLOPRIM) 100 MG tablet Take 100 mg by mouth 2 (two) times daily. 10/18/18  Yes [provider]  alum & mag hydroxide-simeth (GERI-LANTA) 200-200-20 MG/5ML suspension Take 30 mLs by mouth every 6 (six) hours as needed for indigestion or heartburn.   Yes [provider]  amLODipine (NORVASC) 10 MG tablet Take 10 mg by mouth daily. 10/18/18  Yes [provider]  atorvastatin (LIPITOR) 20 MG tablet Take 20 mg by mouth at bedtime.  10/18/18  Yes [provider]  benztropine (COGENTIN) 1 MG tablet Take 1.5 mg by mouth 2 (two) times daily.    Yes [provider]  buPROPion (WELLBUTRIN XL) 150 MG 24 hr tablet Take 150 mg by mouth daily. 10/18/18  Yes [provider]  fluticasone (FLONASE) 50 MCG/ACT nasal spray  Place 2 sprays into both nostrils daily as needed for allergies or rhinitis.   Yes [provider]  guaiFENesin (ROBITUSSIN) 100 MG/5ML liquid Take 200 mg by mouth every 6 (six) hours as needed for cough.   Yes [provider]  ibuprofen (ADVIL,MOTRIN) 200 MG tablet Take 400 mg by mouth every 6 (six) hours as needed for mild  pain.   Yes [provider]  indomethacin (INDOCIN) 50 MG capsule Take 50 mg by mouth 3 (three) times daily as needed (gout flare ups).   Yes [provider]  lisinopril (PRINIVIL,ZESTRIL) 5 MG tablet Take 5 mg by mouth daily. 10/18/18  Yes [provider]  loperamide (IMODIUM) 2 MG capsule Take 2 mg by mouth See admin instructions. Take 2 mg by mouth after each loose stool. Max of 3 doses in 12 hours. Notify MD if diarrhea persists past 12 hours   Yes [provider]  loxapine (LOXITANE) 10 MG capsule Take 20 mg by mouth at bedtime. 09/23/18  Yes [provider]  omeprazole (PRILOSEC) 20 MG capsule Take 20 mg by mouth daily. 10/18/18  Yes [provider]  Paliperidone Palmitate (INVEGA TRINZA) 819 MG/2.625ML SUSP Inject 1 Applicatorful into the muscle every 3 (three) months.   Yes [provider]  sertraline (ZOLOFT) 100 MG tablet Take 150 mg by mouth daily.    Yes [provider]  sodium phosphate (FLEET) 7-19 GM/118ML ENEM Place 1 enema rectally See admin instructions. If no relief from milk of mag and prune juice, give 1 enema rectally as needed for constipation. Contact MD if not relieved. Notify MD if constipation is accompanied by severe abdominal pain   Yes [provider]  traZODone (DESYREL) 150 MG tablet Take 150 mg by mouth at bedtime.   Yes [provider]    Physical Exam: Vitals:   11/12/18 2057 11/12/18 2130 11/12/18 2200 11/12/18 2230  BP:  121/81 104/67 123/85  Pulse:  70 66 68  Resp:   17 16  Temp:      TempSrc:      SpO2: 99% 94% 98% 97%  Height:        Constitutional: NAD, calm, comfortable Eyes: PERRL, lids and conjunctivae normal ENMT: Mucous membranes are mildly dry. Posterior pharynx clear of any exudate or lesions. Neck: normal, supple, no masses, no thyromegaly Respiratory: Decreased breath sounds on bases, otherwise clear to auscultation bilaterally, no wheezing, no crackles.  Normal respiratory effort. No accessory muscle use.  Cardiovascular: Regular rate and rhythm, no murmurs / rubs / gallops. No extremity edema. 2+ pedal pulses. No carotid bruits.  Abdomen: Nondistended, bowel sounds positive. soft, no tenderness, no masses palpated. No hepatosplenomegaly.  Musculoskeletal: no clubbing / cyanosis. Good ROM, no contractures. Normal muscle tone.  Skin: no rashes, lesions, ulcers on limited dermatological examination. Neurologic: CN 2-12 grossly intact. Sensation intact, DTR normal. Strength 5/5 in all 4.  Psychiatric: Normal judgment and insight. Alert and oriented x 3. Normal mood.   Labs on Admission: I have personally reviewed following labs and imaging studies  CBC: Recent Labs  Lab 11/12/18 2132  WBC 12.2*  NEUTROABS 9.2*  HGB 14.7  HCT 43.9  MCV 89.6  PLT 182   Basic Metabolic Panel: Recent Labs  Lab 11/12/18 2132  NA 141  K 3.8  CL 107  CO2 26  GLUCOSE 99  BUN 6  CREATININE 0.92  CALCIUM 9.1   GFR: CrCl cannot be calculated (Unknown ideal weight.). Liver Function Tests: Recent  Labs  Lab 11/12/18 2132  AST 49*  ALT 25  ALKPHOS 38  BILITOT 0.8  PROT 7.1  ALBUMIN 4.4   Recent Labs  Lab 11/12/18 2132  LIPASE 20   No results for input(s): AMMONIA in the last 168 hours. Coagulation Profile: No results for input(s): INR, PROTIME in the last 168 hours. Cardiac Enzymes: No results for input(s): CKTOTAL, CKMB, CKMBINDEX, TROPONINI in the last 168 hours. BNP (last 3 results) No results for input(s): PROBNP in the last 8760 hours. HbA1C: No results for input(s): HGBA1C in the last 72 hours. CBG: No results for input(s): GLUCAP in the last 168 hours. Lipid Profile: No results for input(s): CHOL, HDL, LDLCALC, TRIG, CHOLHDL, LDLDIRECT in the last 72 hours. Thyroid Function Tests: No results for input(s): TSH, T4TOTAL, FREET4, T3FREE, THYROIDAB in the last 72 hours. Anemia Panel: No results for input(s): VITAMINB12, FOLATE,  FERRITIN, TIBC, IRON, RETICCTPCT in the last 72 hours. Urine analysis:    Component Value Date/Time   COLORURINE YELLOW 11/12/2018 2156   APPEARANCEUR CLEAR 11/12/2018 2156   LABSPEC 1.008 11/12/2018 2156   PHURINE 6.0 11/12/2018 2156   GLUCOSEU NEGATIVE 11/12/2018 2156   HGBUR NEGATIVE 11/12/2018 2156   BILIRUBINUR NEGATIVE 11/12/2018 2156   KETONESUR NEGATIVE 11/12/2018 2156   PROTEINUR NEGATIVE 11/12/2018 2156   NITRITE NEGATIVE 11/12/2018 2156   LEUKOCYTESUR NEGATIVE 11/12/2018 2156    Radiological Exams on Admission: Ct Abdomen Pelvis W Contrast  Result Date: 11/12/2018 CLINICAL DATA:  Bowel obstruction, low grade. Abdominal pain. No bowel movement for 4-5 days. EXAM: CT ABDOMEN AND PELVIS WITH CONTRAST TECHNIQUE: Multidetector CT imaging of the abdomen and pelvis was performed using the standard protocol following bolus administration of intravenous contrast. CONTRAST:  ISOVUE-300 IOPAMIDOL (ISOVUE-300) INJECTION 61% COMPARISON:  Radiographs earlier this day. FINDINGS: Lower chest: Mild subpleural atelectasis or scarring at the lung bases. Trace right pleural effusion. Hepatobiliary: Subcapsular subdiaphragmatic subcentimeter cyst in the right lobe of the liver. No suspicious hepatic abnormality. Gallbladder partially distended. No calcified gallstone or pericholecystic inflammation. No biliary dilatation. Pancreas: No ductal dilatation or inflammation. Spleen: Normal in size without focal abnormality. Adrenals/Urinary Tract: Normal adrenal glands. No hydronephrosis or perinephric edema. Homogeneous renal enhancement with symmetric excretion on delayed phase imaging. 9 mm hypodensity in the medial right kidney is too small to accurately characterize but likely cyst. Urinary bladder is physiologically distended without wall thickening. Stomach/Bowel: Small hiatal hernia. Mild distal esophageal wall thickening. Stomach physiologically distended. Normal positioning of the ligament of  Treitz. Dilated fluid-filled small bowel in the left upper quadrant. Dilated small bowel fecalized contents in the left mid abdomen, with bowel loops are opposed to the anterior abdominal wall. Mild associated mesenteric edema small amount of adjacent free fluid fluid. Distal small bowel are nondilated, however a discrete transition point is difficult to delineate. Moderate volume of stool throughout the colon without colonic wall thickening or inflammatory change. Appendix is surgically absent. Vascular/Lymphatic: Mesenteric vessels, portal and splenic vein are patent. Normal caliber abdominal aorta. No enlarged abdominal or pelvic lymph nodes. Reproductive: Prostate is unremarkable. Other: Minimal free fluid in the pelvis is likely reactive. No free air. No intra-abdominal abscess. Musculoskeletal: There are no acute or suspicious osseous abnormalities. Incidental note of 4 non-rib-bearing lumbar vertebra with transitional lumbosacral anatomy. IMPRESSION: 1. Findings consistent with small bowel obstruction. A discrete transition point is difficult to delineate, however suspected in the left mid abdomen. This is likely secondary to adhesions. 2. Moderate stool burden throughout the colon suggesting  constipation. Electronically Signed   By: Narda Rutherford M.D.   On: 11/12/2018 23:18   Dg Abd Acute W/chest  Result Date: 11/12/2018 CLINICAL DATA:  Initial evaluation for acute abdominal pain. EXAM: DG ABDOMEN ACUTE W/ 1V CHEST COMPARISON:  Prior radiograph from 04/18/2016. FINDINGS: Cardiac and mediastinal silhouettes within normal limits. Lungs normally inflated. Scattered bronchitic changes, like related history of COPD. No focal infiltrates. No edema or effusion. No pneumothorax. Multiple prominent gas-filled loops of small bowel seen within the upper and left abdomen, measuring up to approximately 3.9 cm in diameter. Few air-fluid levels seen on upright projection. Paucity of gas distally. Findings  concerning for possible small bowel obstruction. Large volume retained stool throughout the colon, suggesting concomitant constipation. No free air. No soft tissue mass or abnormal calcification. Osseous structures demonstrate no acute finding. IMPRESSION: 1. Few mildly dilated gas-filled loops of small bowel within the upper mid and left abdomen with associated air-fluid levels, concerning for small bowel obstruction. Further assessment with cross-sectional imaging suggested. 2. Large volume retained stool within the nondilated colon, suggesting concomitant constipation. 3. Diffuse bronchitic changes, suspected to be related history of COPD. No other active cardiopulmonary disease. Electronically Signed   By: Rise Mu M.D.   On: 11/12/2018 21:40    EKG: Independently reviewed.   Assessment/Plan Principal Problem:   SBO (small bowel obstruction) (HCC) Admit to MedSurg/inpatient. Keep n.p.o. Continue IV fluids. Analgesics and antiemetics as needed. He was unable to tolerate NGT, however abdomen is soft and nondistended. Will try milk of molasses enema to resolve constipation. Consider general surgery evaluation if no resolution in a.m.  Active Problems:   Hyperlipidemia Hold atorvastatin for now.    COPD (chronic obstructive pulmonary disease) (HCC) Supplemental oxygen and bronchodilators as needed.    Depression Hold Cogentin, Loxitane, Invega, sertraline and trazodone. Resume once clear for oral intake.    Hypertension Monitor blood pressure. Resume antihypertensives once clear for oral intake.    Gout Hold allopurinol while n.p.o.    DVT prophylaxis: Heparin SQ. Code Status: Full code. Family Communication: Disposition Plan: Admit for symptoms control, IV hydration and NGT suction and. Consults called: Admission status: Inpatient/MedSurg.   Bobette Mo MD [facility] Pager [phone].  If 7PM-7AM, please contact  night-coverage [email or web address] Password [facility]  11/12/2018, 11:45 PM   This document was prepared using Dragon voice recognition software and may contain some unintended transcription errors.

## 2018-11-12 NOTE — ED Triage Notes (Addendum)
Pt BIB [facility]. Pt states his stomach is "hurting all over". Hasn't had a bowel movement in about 4-5 days. He states this usually happens when he doesn't eat. But states he has had normal meals starting today.

## 2018-11-12 NOTE — ED Notes (Signed)
Urine culture sent down to lab with urine sample 

## 2018-11-12 NOTE — ED Notes (Signed)
Patient transported to CT 

## 2018-11-12 NOTE — ED Provider Notes (Signed)
[facility]-EMERGENCY DEPT Provider Note   CSN: [id number] Arrival date & time: 11/12/18  2029     History   Chief Complaint Chief Complaint  Patient presents with  . Abdominal Pain    HPI Gerald Jackson is a 42 y.o. male.  HPI Pt started having diffuse abd pain after lunch today.  The cramping in nature.  He also vomited once. He was nauseated but that stopped after vomiting.   The pain was severe earlier but has eased off now. No dirrhea or constipation.  No urinary trouble.   Hx of prior appendectomy.  Past Medical History:  Diagnosis Date  . COPD (chronic obstructive pulmonary disease) (HCC)   . Depression     There are no active problems to display for this patient.   Past Surgical History:  Procedure Laterality Date  . APPENDECTOMY          Home Medications    Prior to Admission medications   Medication Sig Start Date End Date Taking? Authorizing Provider  acetaminophen (TYLENOL) 325 MG tablet Take 650 mg by mouth every 4 (four) hours as needed for mild pain or headache.   Yes [provider]  allopurinol (ZYLOPRIM) 100 MG tablet Take 100 mg by mouth 2 (two) times daily. 10/18/18  Yes [provider]  alum & mag hydroxide-simeth (GERI-LANTA) 200-200-20 MG/5ML suspension Take 30 mLs by mouth every 6 (six) hours as needed for indigestion or heartburn.   Yes [provider]  amLODipine (NORVASC) 10 MG tablet Take 10 mg by mouth daily. 10/18/18  Yes [provider]  atorvastatin (LIPITOR) 20 MG tablet Take 20 mg by mouth at bedtime.  10/18/18  Yes [provider]  benztropine (COGENTIN) 1 MG tablet Take 1.5 mg by mouth 2 (two) times daily.    Yes [provider]  buPROPion (WELLBUTRIN XL) 150 MG 24 hr tablet Take 150 mg by mouth daily. 10/18/18  Yes [provider]  fluticasone (FLONASE) 50 MCG/ACT nasal spray Place 2 sprays into both nostrils daily as needed for allergies or rhinitis.    Yes [provider]  guaiFENesin (ROBITUSSIN) 100 MG/5ML liquid Take 200 mg by mouth every 6 (six) hours as needed for cough.   Yes [provider]  ibuprofen (ADVIL,MOTRIN) 200 MG tablet Take 400 mg by mouth every 6 (six) hours as needed for mild pain.   Yes [provider]  indomethacin (INDOCIN) 50 MG capsule Take 50 mg by mouth 3 (three) times daily as needed (gout flare ups).   Yes [provider]  lisinopril (PRINIVIL,ZESTRIL) 5 MG tablet Take 5 mg by mouth daily. 10/18/18  Yes [provider]  loperamide (IMODIUM) 2 MG capsule Take 2 mg by mouth See admin instructions. Take 2 mg by mouth after each loose stool. Max of 3 doses in 12 hours. Notify MD if diarrhea persists past 12 hours   Yes [provider]  loxapine (LOXITANE) 10 MG capsule Take 20 mg by mouth at bedtime. 09/23/18  Yes [provider]  omeprazole (PRILOSEC) 20 MG capsule Take 20 mg by mouth daily. 10/18/18  Yes [provider]  Paliperidone Palmitate (INVEGA TRINZA) 819 MG/2.625ML SUSP Inject 1 Applicatorful into the muscle every 3 (three) months.   Yes [provider]  sertraline (ZOLOFT) 100 MG tablet Take 150 mg by mouth daily.    Yes [provider]  sodium phosphate (FLEET) 7-19 GM/118ML ENEM Place 1 enema rectally See admin instructions. If no  relief from milk of mag and prune juice, give 1 enema rectally as needed for constipation. Contact MD if not relieved. Notify MD if constipation is accompanied by severe abdominal pain   Yes [provider]  traZODone (DESYREL) 150 MG tablet Take 150 mg by mouth at bedtime.   Yes [provider]    Family History History reviewed. No pertinent family history.  Social History Social History   Tobacco Use  . Smoking status: Current Some Day Smoker    Packs/day: 0.50    Types: Cigarettes  . Smokeless tobacco: Never Used  Substance Use Topics  . Alcohol use: Yes    Comment:  occasionally  . Drug use: Yes    Types: Marijuana     Allergies   Patient has no known allergies.   Review of Systems Review of Systems  All other systems reviewed and are negative.    Physical Exam Updated Vital Signs BP 123/85   Pulse 68   Temp 98.9 F (37.2 C) (Oral)   Resp 16   Ht 1.88 m (6\' 2" )   SpO2 97%   BMI 29.79 kg/m   Physical Exam  Constitutional: He appears well-developed and well-nourished. No distress.  HENT:  Head: Normocephalic and atraumatic.  Right Ear: External ear normal.  Left Ear: External ear normal.  Eyes: Conjunctivae are normal. Right eye exhibits no discharge. Left eye exhibits no discharge. No scleral icterus.  Neck: Neck supple. No tracheal deviation present.  Cardiovascular: Normal rate, regular rhythm and intact distal pulses.  Pulmonary/Chest: Effort normal and breath sounds normal. No stridor. No respiratory distress. He has no wheezes. He has no rales.  Abdominal: Soft. Bowel sounds are normal. He exhibits no distension. There is no tenderness. There is no rebound and no guarding. No hernia.  Mild ttp periumbilical  Musculoskeletal: He exhibits no edema or tenderness.  Neurological: He is alert. He has normal strength. No cranial nerve deficit (no facial droop, extraocular movements intact, no slurred speech) or sensory deficit. He exhibits normal muscle tone. He displays no seizure activity. Coordination normal.  Skin: Skin is warm and dry. No rash noted.  Psychiatric: He has a normal mood and affect.  Nursing note and vitals reviewed.    ED Treatments / Results  Labs (all labs ordered are listed, but only abnormal results are displayed) Labs Reviewed  COMPREHENSIVE METABOLIC PANEL - Abnormal; Notable for the following components:      Result Value   AST 49 (*)    All other components within normal limits  CBC WITH DIFFERENTIAL/PLATELET - Abnormal; Notable for the following components:   WBC 12.2 (*)    Neutro Abs 9.2 (*)      All other components within normal limits  LIPASE, BLOOD  URINALYSIS, ROUTINE W REFLEX MICROSCOPIC    EKG None  Radiology Ct Abdomen Pelvis W Contrast  Result Date: 11/12/2018 CLINICAL DATA:  Bowel obstruction, low grade. Abdominal pain. No bowel movement for 4-5 days. EXAM: CT ABDOMEN AND PELVIS WITH CONTRAST TECHNIQUE: Multidetector CT imaging of the abdomen and pelvis was performed using the standard protocol following bolus administration of intravenous contrast. CONTRAST:  ISOVUE-300 IOPAMIDOL (ISOVUE-300) INJECTION 61% COMPARISON:  Radiographs earlier this day. FINDINGS: Lower chest: Mild subpleural atelectasis or scarring at the lung bases. Trace right pleural effusion. Hepatobiliary: Subcapsular subdiaphragmatic subcentimeter cyst in the right lobe of the liver. No suspicious hepatic abnormality. Gallbladder partially distended. No calcified gallstone or pericholecystic inflammation. No biliary dilatation. Pancreas: No ductal dilatation  or inflammation. Spleen: Normal in size without focal abnormality. Adrenals/Urinary Tract: Normal adrenal glands. No hydronephrosis or perinephric edema. Homogeneous renal enhancement with symmetric excretion on delayed phase imaging. 9 mm hypodensity in the medial right kidney is too small to accurately characterize but likely cyst. Urinary bladder is physiologically distended without wall thickening. Stomach/Bowel: Small hiatal hernia. Mild distal esophageal wall thickening. Stomach physiologically distended. Normal positioning of the ligament of Treitz. Dilated fluid-filled small bowel in the left upper quadrant. Dilated small bowel fecalized contents in the left mid abdomen, with bowel loops are opposed to the anterior abdominal wall. Mild associated mesenteric edema small amount of adjacent free fluid fluid. Distal small bowel are nondilated, however a discrete transition point is difficult to delineate. Moderate volume of stool throughout the colon  without colonic wall thickening or inflammatory change. Appendix is surgically absent. Vascular/Lymphatic: Mesenteric vessels, portal and splenic vein are patent. Normal caliber abdominal aorta. No enlarged abdominal or pelvic lymph nodes. Reproductive: Prostate is unremarkable. Other: Minimal free fluid in the pelvis is likely reactive. No free air. No intra-abdominal abscess. Musculoskeletal: There are no acute or suspicious osseous abnormalities. Incidental note of 4 non-rib-bearing lumbar vertebra with transitional lumbosacral anatomy. IMPRESSION: 1. Findings consistent with small bowel obstruction. A discrete transition point is difficult to delineate, however suspected in the left mid abdomen. This is likely secondary to adhesions. 2. Moderate stool burden throughout the colon suggesting constipation. Electronically Signed   By: Narda Rutherford M.D.   On: 11/12/2018 23:18   Dg Abd Acute W/chest  Result Date: 11/12/2018 CLINICAL DATA:  Initial evaluation for acute abdominal pain. EXAM: DG ABDOMEN ACUTE W/ 1V CHEST COMPARISON:  Prior radiograph from 04/18/2016. FINDINGS: Cardiac and mediastinal silhouettes within normal limits. Lungs normally inflated. Scattered bronchitic changes, like related history of COPD. No focal infiltrates. No edema or effusion. No pneumothorax. Multiple prominent gas-filled loops of small bowel seen within the upper and left abdomen, measuring up to approximately 3.9 cm in diameter. Few air-fluid levels seen on upright projection. Paucity of gas distally. Findings concerning for possible small bowel obstruction. Large volume retained stool throughout the colon, suggesting concomitant constipation. No free air. No soft tissue mass or abnormal calcification. Osseous structures demonstrate no acute finding. IMPRESSION: 1. Few mildly dilated gas-filled loops of small bowel within the upper mid and left abdomen with associated air-fluid levels, concerning for small bowel obstruction.  Further assessment with cross-sectional imaging suggested. 2. Large volume retained stool within the nondilated colon, suggesting concomitant constipation. 3. Diffuse bronchitic changes, suspected to be related history of COPD. No other active cardiopulmonary disease. Electronically Signed   By: Rise Mu M.D.   On: 11/12/2018 21:40    Procedures Procedures (including critical care time)  Medications Ordered in ED Medications  sodium chloride 0.9 % bolus 1,000 mL (0 mLs Intravenous Stopped 11/12/18 2232)    And  0.9 %  sodium chloride infusion ( Intravenous Rate/Dose Verify 11/12/18 2306)  sodium chloride (PF) 0.9 % injection (has no administration in time range)  ondansetron (ZOFRAN) injection 4 mg (4 mg Intravenous Given 11/12/18 2128)  iopamidol (ISOVUE-300) 61 % injection 100 mL (100 mLs Intravenous Contrast Given 11/12/18 2248)     Initial Impression / Assessment and Plan / ED Course  I have reviewed the triage vital signs and the nursing notes.  Pertinent labs & imaging results that were available during my care of the patient were reviewed by me and considered in my medical decision making (see chart for details).  Patient presented to the emergency room for evaluation of abdominal pain.  Labs are normal however the CT scan is consistent with a small bowel obstruction, likely related to adhesions.  NG tube has been ordered.  I will consult the medical service for admission Final Clinical Impressions(s) / ED Diagnoses   Final diagnoses:  Small bowel obstruction (HCC)      Linwood DibblesKnapp, Yurianna Tusing, MD 11/12/18 [phone]

## 2018-11-13 DIAGNOSIS — J449 Chronic obstructive pulmonary disease, unspecified: Secondary | ICD-10-CM

## 2018-11-13 LAB — CBC WITH DIFFERENTIAL/PLATELET
ABS IMMATURE GRANULOCYTES: 0.02 10*3/uL (ref 0.00–0.07)
BASOS PCT: 0 %
Basophils Absolute: 0 10*3/uL (ref 0.0–0.1)
Eosinophils Absolute: 0.1 10*3/uL (ref 0.0–0.5)
Eosinophils Relative: 1 %
HCT: 40.7 % (ref 39.0–52.0)
HEMOGLOBIN: 13.5 g/dL (ref 13.0–17.0)
IMMATURE GRANULOCYTES: 0 %
LYMPHS PCT: 29 %
Lymphs Abs: 2.6 10*3/uL (ref 0.7–4.0)
MCH: 30.3 pg (ref 26.0–34.0)
MCHC: 33.2 g/dL (ref 30.0–36.0)
MCV: 91.3 fL (ref 80.0–100.0)
MONOS PCT: 8 %
Monocytes Absolute: 0.8 10*3/uL (ref 0.1–1.0)
NEUTROS ABS: 5.7 10*3/uL (ref 1.7–7.7)
NEUTROS PCT: 62 %
PLATELETS: 149 10*3/uL — AB (ref 150–400)
RBC: 4.46 MIL/uL (ref 4.22–5.81)
RDW: 12.5 % (ref 11.5–15.5)
WBC: 9.3 10*3/uL (ref 4.0–10.5)
nRBC: 0 % (ref 0.0–0.2)

## 2018-11-13 LAB — COMPREHENSIVE METABOLIC PANEL
ALT: 22 U/L (ref 0–44)
AST: 35 U/L (ref 15–41)
Albumin: 3.6 g/dL (ref 3.5–5.0)
Alkaline Phosphatase: 32 U/L — ABNORMAL LOW (ref 38–126)
Anion gap: 6 (ref 5–15)
BUN: 6 mg/dL (ref 6–20)
CHLORIDE: 111 mmol/L (ref 98–111)
CO2: 25 mmol/L (ref 22–32)
Calcium: 8.6 mg/dL — ABNORMAL LOW (ref 8.9–10.3)
Creatinine, Ser: 0.77 mg/dL (ref 0.61–1.24)
GFR calc Af Amer: >60 mL/min (ref >60)
Glucose, Bld: 93 mg/dL (ref 70–99)
Potassium: 3.9 mmol/L (ref 3.5–5.1)
Sodium: 142 mmol/L (ref 135–145)
Total Bilirubin: 0.8 mg/dL (ref 0.3–1.2)
Total Protein: 6 g/dL — ABNORMAL LOW (ref 6.5–8.1)

## 2018-11-13 LAB — HIV ANTIBODY (ROUTINE TESTING W REFLEX): HIV Screen 4th Generation wRfx: NONREACTIVE

## 2018-11-13 LAB — PHOSPHORUS: Phosphorus: 3.5 mg/dL (ref 2.5–4.6)

## 2018-11-13 LAB — MAGNESIUM: MAGNESIUM: 2 mg/dL (ref 1.7–2.4)

## 2018-11-13 MED ORDER — ONDANSETRON HCL 4 MG/2ML IJ SOLN: 4.0000 mg | Freq: Four times a day (QID) | INTRAMUSCULAR | Status: DC | PRN

## 2018-11-13 MED ORDER — FENTANYL CITRATE (PF) 100 MCG/2ML IJ SOLN: 50.0000 ug | INTRAMUSCULAR | Status: DC | PRN

## 2018-11-13 MED ORDER — FAMOTIDINE IN NACL 20-0.9 MG/50ML-% IV SOLN
20.0000 mg | Freq: Two times a day (BID) | INTRAVENOUS | Status: DC
  Administered 2018-11-13 – 2018-11-14 (×4): 20 mg via INTRAVENOUS
  Filled 2018-11-13 (×4): qty 50

## 2018-11-13 MED ORDER — ONDANSETRON HCL 4 MG PO TABS: 4.0000 mg | ORAL_TABLET | Freq: Four times a day (QID) | ORAL | Status: DC | PRN

## 2018-11-13 MED ORDER — ACETAMINOPHEN 650 MG RE SUPP: 650.0000 mg | Freq: Four times a day (QID) | RECTAL | Status: DC | PRN

## 2018-11-13 MED ORDER — INFLUENZA VAC SPLIT QUAD 0.5 ML IM SUSY: 0.5000 mL | PREFILLED_SYRINGE | INTRAMUSCULAR | Status: DC

## 2018-11-13 MED ORDER — ACETAMINOPHEN 325 MG PO TABS: 650.0000 mg | ORAL_TABLET | Freq: Four times a day (QID) | ORAL | Status: DC | PRN

## 2018-11-13 MED ORDER — MILK AND MOLASSES ENEMA
1.0000 | Freq: Once | RECTAL | Status: AC
  Administered 2018-11-13: 250 mL via RECTAL
  Filled 2018-11-13: qty 250

## 2018-11-13 NOTE — Progress Notes (Signed)
Milk and molasses enema given. Pt tolerated well and retained fluid 15 min.  Large results of formed stool returned.

## 2018-11-13 NOTE — Progress Notes (Signed)
Admitted to 1519 from ed.

## 2018-11-13 NOTE — ED Notes (Signed)
ED TO INPATIENT HANDOFF REPORT  Name/Age/Gender Gerald Jackson 42 y.o. male  Code Status   Home/SNF/Other Home  Chief Complaint abdominal pains  Level of Care/Admitting Diagnosis ED Disposition    ED Disposition Condition Comment   Admit  Hospital Area: [facility] [[id number]]  Level of Care: Med-Surg [16]  Diagnosis: SBO (small bowel obstruction) [facility]) [[id number]]  Admitting Physician: Bobette Mo [[id number]]  Attending Physician: Bobette Mo [[id number]]  Estimated length of stay: past midnight tomorrow  Certification:: I certify this patient will need inpatient services for at least 2 midnights  PT Class (Do Not Modify): Inpatient [101]  PT Acc Code (Do Not Modify): Private [1]       Medical History Past Medical History:  Diagnosis Date  . COPD (chronic obstructive pulmonary disease) (HCC)   . Depression   . Gout 11/12/2018  . Hyperlipidemia 11/12/2018  . Hypertension 11/12/2018    Allergies No Known Allergies  IV Location/Drains/Wounds Patient Lines/Drains/Airways Status   Active Line/Drains/Airways    Name:   Placement date:   Placement time:   Site:   Days:   Peripheral IV 11/12/18 Left Antecubital   11/12/18    2125    Antecubital   1          Labs/Imaging Results for orders placed or performed during the hospital encounter of 11/12/18 (from the past 48 hour(s))  Comprehensive metabolic panel     Status: Abnormal   Collection Time: 11/12/18  9:32 PM  Result Value Ref Range   Sodium 141 135 - 145 mmol/L   Potassium 3.8 3.5 - 5.1 mmol/L   Chloride 107 98 - 111 mmol/L   CO2 26 22 - 32 mmol/L   Glucose, Bld 99 70 - 99 mg/dL   BUN 6 6 - 20 mg/dL   Creatinine, Ser 2.95 0.61 - 1.24 mg/dL   Calcium 9.1 8.9 - 62.1 mg/dL   Total Protein 7.1 6.5 - 8.1 g/dL   Albumin 4.4 3.5 - 5.0 g/dL   AST 49 (H) 15 - 41 U/L   ALT 25 0 - 44 U/L   Alkaline Phosphatase 38 38 - 126 U/L   Total Bilirubin 0.8 0.3 - 1.2 mg/dL   GFR calc non Af  Amer >60 >60 mL/min   GFR calc Af Amer >60 >60 mL/min   Anion gap 8 5 - 15    Comment: Performed at [facility], [address] [place], [place] [address]  Lipase, blood     Status: None   Collection Time: 11/12/18  9:32 PM  Result Value Ref Range   Lipase 20 11 - 51 U/L    Comment: Performed at [facility], [address], [place], [place] [address]  CBC WITH DIFFERENTIAL     Status: Abnormal   Collection Time: 11/12/18  9:32 PM  Result Value Ref Range   WBC 12.2 (H) 4.0 - 10.5 K/uL   RBC 4.90 4.22 - 5.81 MIL/uL   Hemoglobin 14.7 13.0 - 17.0 g/dL   HCT 62.9 52.8 - 41.3 %   MCV 89.6 80.0 - 100.0 fL   MCH 30.0 26.0 - 34.0 pg   MCHC 33.5 30.0 - 36.0 g/dL   RDW 24.4 01.0 - 27.2 %   Platelets 182 150 - 400 K/uL   nRBC 0.0 0.0 - 0.2 %   Neutrophils Relative % 76 %   Neutro Abs 9.2 (H) 1.7 - 7.7 K/uL   Lymphocytes Relative 17 %  Lymphs Abs 2.1 0.7 - 4.0 K/uL   Monocytes Relative 6 %   Monocytes Absolute 0.7 0.1 - 1.0 K/uL   Eosinophils Relative 1 %   Eosinophils Absolute 0.1 0.0 - 0.5 K/uL   Basophils Relative 0 %   Basophils Absolute 0.0 0.0 - 0.1 K/uL   Immature Granulocytes 0 %   Abs Immature Granulocytes 0.05 0.00 - 0.07 K/uL    Comment: Performed at [facility], [address] [place], [place] [address]  Urinalysis, Routine w reflex microscopic     Status: None   Collection Time: 11/12/18  9:56 PM  Result Value Ref Range   Color, Urine YELLOW YELLOW   APPearance CLEAR CLEAR   Specific Gravity, Urine 1.008 1.005 - 1.030   pH 6.0 5.0 - 8.0   Glucose, UA NEGATIVE NEGATIVE mg/dL   Hgb urine dipstick NEGATIVE NEGATIVE   Bilirubin Urine NEGATIVE NEGATIVE   Ketones, ur NEGATIVE NEGATIVE mg/dL   Protein, ur NEGATIVE NEGATIVE mg/dL   Nitrite NEGATIVE NEGATIVE   Leukocytes, UA NEGATIVE NEGATIVE    Comment: Performed at [facility], [address] [place], [place] [address]   Ct Abdomen Pelvis W  Contrast  Result Date: 11/12/2018 CLINICAL DATA:  Bowel obstruction, low grade. Abdominal pain. No bowel movement for 4-5 days. EXAM: CT ABDOMEN AND PELVIS WITH CONTRAST TECHNIQUE: Multidetector CT imaging of the abdomen and pelvis was performed using the standard protocol following bolus administration of intravenous contrast. CONTRAST:  100mL ISOVUE-300 IOPAMIDOL (ISOVUE-300) INJECTION 61% COMPARISON:  Radiographs earlier this day. FINDINGS: Lower chest: Mild subpleural atelectasis or scarring at the lung bases. Trace right pleural effusion. Hepatobiliary: Subcapsular subdiaphragmatic subcentimeter cyst in the right lobe of the liver. No suspicious hepatic abnormality. Gallbladder partially distended. No calcified gallstone or pericholecystic inflammation. No biliary dilatation. Pancreas: No ductal dilatation or inflammation. Spleen: Normal in size without focal abnormality. Adrenals/Urinary Tract: Normal adrenal glands. No hydronephrosis or perinephric edema. Homogeneous renal enhancement with symmetric excretion on delayed phase imaging. 9 mm hypodensity in the medial right kidney is too small to accurately characterize but likely cyst. Urinary bladder is physiologically distended without wall thickening. Stomach/Bowel: Small hiatal hernia. Mild distal esophageal wall thickening. Stomach physiologically distended. Normal positioning of the ligament of Treitz. Dilated fluid-filled small bowel in the left upper quadrant. Dilated small bowel fecalized contents in the left mid abdomen, with bowel loops are opposed to the anterior abdominal wall. Mild associated mesenteric edema small amount of adjacent free fluid fluid. Distal small bowel are nondilated, however a discrete transition point is difficult to delineate. Moderate volume of stool throughout the colon without colonic wall thickening or inflammatory change. Appendix is surgically absent. Vascular/Lymphatic: Mesenteric vessels, portal and splenic vein are  patent. Normal caliber abdominal aorta. No enlarged abdominal or pelvic lymph nodes. Reproductive: Prostate is unremarkable. Other: Minimal free fluid in the pelvis is likely reactive. No free air. No intra-abdominal abscess. Musculoskeletal: There are no acute or suspicious osseous abnormalities. Incidental note of 4 non-rib-bearing lumbar vertebra with transitional lumbosacral anatomy. IMPRESSION: 1. Findings consistent with small bowel obstruction. A discrete transition point is difficult to delineate, however suspected in the left mid abdomen. This is likely secondary to adhesions. 2. Moderate stool burden throughout the colon suggesting constipation. Electronically Signed   By: Narda RutherfordMelanie  Sanford M.D.   On: 11/12/2018 23:18   Dg Abd Acute W/chest  Result Date: 11/12/2018 CLINICAL DATA:  Initial evaluation for acute abdominal pain. EXAM: DG ABDOMEN ACUTE W/ 1V CHEST  COMPARISON:  Prior radiograph from 04/18/2016. FINDINGS: Cardiac and mediastinal silhouettes within normal limits. Lungs normally inflated. Scattered bronchitic changes, like related history of COPD. No focal infiltrates. No edema or effusion. No pneumothorax. Multiple prominent gas-filled loops of small bowel seen within the upper and left abdomen, measuring up to approximately 3.9 cm in diameter. Few air-fluid levels seen on upright projection. Paucity of gas distally. Findings concerning for possible small bowel obstruction. Large volume retained stool throughout the colon, suggesting concomitant constipation. No free air. No soft tissue mass or abnormal calcification. Osseous structures demonstrate no acute finding. IMPRESSION: 1. Few mildly dilated gas-filled loops of small bowel within the upper mid and left abdomen with associated air-fluid levels, concerning for small bowel obstruction. Further assessment with cross-sectional imaging suggested. 2. Large volume retained stool within the nondilated colon, suggesting concomitant constipation.  3. Diffuse bronchitic changes, suspected to be related history of COPD. No other active cardiopulmonary disease. Electronically Signed   By: Rise Mu M.D.   On: 11/12/2018 21:40    Pending Labs Unresulted Labs (From admission, onward)    Start     Ordered   11/12/18 2343  Magnesium  Add-on,   R     11/12/18 2342   11/12/18 2343  Phosphorus  Add-on,   R     11/12/18 2342          Vitals/Pain Today's Vitals   11/12/18 2130 11/12/18 2200 11/12/18 2219 11/12/18 2230  BP: 121/81 104/67  123/85  Pulse: 70 66  68  Resp:  17  16  Temp:      TempSrc:      SpO2: 94% 98%  97%  Height:      PainSc:   0-No pain     Isolation Precautions No active isolations  Medications Medications  0.9 % NaCl with KCl 20 mEq/ L  infusion (has no administration in time range)  sodium chloride 0.9 % bolus 1,000 mL (0 mLs Intravenous Stopped 11/12/18 2232)  ondansetron (ZOFRAN) injection 4 mg (4 mg Intravenous Given 11/12/18 2128)  iopamidol (ISOVUE-300) 61 % injection 100 mL (100 mLs Intravenous Contrast Given 11/12/18 2248)  sodium chloride (PF) 0.9 % injection (  Given by Other 11/12/18 2245)    Mobility walks

## 2018-11-13 NOTE — Progress Notes (Signed)
[facility] PROGRESS NOTE  Gerald Jackson ZOX:[id number] DOB: [birth year] DOA: 11/12/2018  PCP: Lewis Moccasin, MD  Brief History/Interval Summary: 42 y.o. male with medical history significant of COPD, depression, gout, hyperlipidemia, hypertension who presented to the emergency department with complaints of abdominal pain .  He also had an episode of vomiting.  CT scan raise concern for small bowel obstruction.  Patient mentioned being constipated for several days.  Patient was admitted to the hospital for further management.    Reason for Visit: Small bowel obstruction  Consultants: None  Procedures: None  Antibiotics: None  Subjective/Interval History: Patient states that he feels better this morning.  He had a large bowel movement last night.  He has been passing gas from below.  No further episodes of nausea or vomiting.  Requesting something to eat and drink.  ROS: Denies any headaches.  Objective:  Vital Signs  Vitals:   11/13/18 0034 11/13/18 0100 11/13/18 0510 11/13/18 1356  BP: (!) 87/60  112/84 119/83  Pulse: 61  60 65  Resp: 20  20 20   Temp: 98.5 F (36.9 C)  97.9 F (36.6 C) 98.7 F (37.1 C)  TempSrc:   Oral Oral  SpO2: 96%  98% 97%  Height:  6\' 2"  (1.88 m)      Intake/Output Summary (Last 24 hours) at 11/13/2018 1444 Last data filed at 11/12/2018 2347 Gross per 24 hour  Intake 2103.28 ml  Output -  Net 2103.28 ml   There were no vitals filed for this visit.  General appearance: alert, cooperative, appears stated age and no distress Resp: clear to auscultation bilaterally Cardio: regular rate and rhythm, S1, S2 normal, no murmur, click, rub or gallop GI: soft, non-tender; bowel sounds normal; no masses,  no organomegaly Extremities: extremities normal, atraumatic, no cyanosis or edema Neurologic: Alert and oriented x3.  No focal neurological deficits.  Lab Results:  Data Reviewed: I have personally reviewed following labs and  imaging studies  CBC: Recent Labs  Lab 11/12/18 2132 11/13/18 0636  WBC 12.2* 9.3  NEUTROABS 9.2* 5.7  HGB 14.7 13.5  HCT 43.9 40.7  MCV 89.6 91.3  PLT 182 149*    Basic Metabolic Panel: Recent Labs  Lab 11/12/18 2132 11/12/18 2359 11/13/18 0636  NA 141  --  142  K 3.8  --  3.9  CL 107  --  111  CO2 26  --  25  GLUCOSE 99  --  93  BUN 6  --  6  CREATININE 0.92  --  0.77  CALCIUM 9.1  --  8.6*  MG  --  2.0  --   PHOS  --  3.5  --     GFR: CrCl cannot be calculated (Unknown ideal weight.).  Liver Function Tests: Recent Labs  Lab 11/12/18 2132 11/13/18 0636  AST 49* 35  ALT 25 22  ALKPHOS 38 32*  BILITOT 0.8 0.8  PROT 7.1 6.0*  ALBUMIN 4.4 3.6    Recent Labs  Lab 11/12/18 2132  LIPASE 20     Radiology Studies: Ct Abdomen Pelvis W Contrast  Result Date: 11/12/2018 CLINICAL DATA:  Bowel obstruction, low grade. Abdominal pain. No bowel movement for 4-5 days. EXAM: CT ABDOMEN AND PELVIS WITH CONTRAST TECHNIQUE: Multidetector CT imaging of the abdomen and pelvis was performed using the standard protocol following bolus administration of intravenous contrast. CONTRAST:  ISOVUE-300 IOPAMIDOL (ISOVUE-300) INJECTION 61% COMPARISON:  Radiographs earlier this day. FINDINGS: Lower chest: Mild subpleural atelectasis  or scarring at the lung bases. Trace right pleural effusion. Hepatobiliary: Subcapsular subdiaphragmatic subcentimeter cyst in the right lobe of the liver. No suspicious hepatic abnormality. Gallbladder partially distended. No calcified gallstone or pericholecystic inflammation. No biliary dilatation. Pancreas: No ductal dilatation or inflammation. Spleen: Normal in size without focal abnormality. Adrenals/Urinary Tract: Normal adrenal glands. No hydronephrosis or perinephric edema. Homogeneous renal enhancement with symmetric excretion on delayed phase imaging. 9 mm hypodensity in the medial right kidney is too small to accurately characterize but likely  cyst. Urinary bladder is physiologically distended without wall thickening. Stomach/Bowel: Small hiatal hernia. Mild distal esophageal wall thickening. Stomach physiologically distended. Normal positioning of the ligament of Treitz. Dilated fluid-filled small bowel in the left upper quadrant. Dilated small bowel fecalized contents in the left mid abdomen, with bowel loops are opposed to the anterior abdominal wall. Mild associated mesenteric edema small amount of adjacent free fluid fluid. Distal small bowel are nondilated, however a discrete transition point is difficult to delineate. Moderate volume of stool throughout the colon without colonic wall thickening or inflammatory change. Appendix is surgically absent. Vascular/Lymphatic: Mesenteric vessels, portal and splenic vein are patent. Normal caliber abdominal aorta. No enlarged abdominal or pelvic lymph nodes. Reproductive: Prostate is unremarkable. Other: Minimal free fluid in the pelvis is likely reactive. No free air. No intra-abdominal abscess. Musculoskeletal: There are no acute or suspicious osseous abnormalities. Incidental note of 4 non-rib-bearing lumbar vertebra with transitional lumbosacral anatomy. IMPRESSION: 1. Findings consistent with small bowel obstruction. A discrete transition point is difficult to delineate, however suspected in the left mid abdomen. This is likely secondary to adhesions. 2. Moderate stool burden throughout the colon suggesting constipation. Electronically Signed   By: Narda RutherfordMelanie  Sanford M.D.   On: 11/12/2018 23:18   Dg Abd Acute W/chest  Result Date: 11/12/2018 CLINICAL DATA:  Initial evaluation for acute abdominal pain. EXAM: DG ABDOMEN ACUTE W/ 1V CHEST COMPARISON:  Prior radiograph from 04/18/2016. FINDINGS: Cardiac and mediastinal silhouettes within normal limits. Lungs normally inflated. Scattered bronchitic changes, like related history of COPD. No focal infiltrates. No edema or effusion. No pneumothorax. Multiple  prominent gas-filled loops of small bowel seen within the upper and left abdomen, measuring up to approximately 3.9 cm in diameter. Few air-fluid levels seen on upright projection. Paucity of gas distally. Findings concerning for possible small bowel obstruction. Large volume retained stool throughout the colon, suggesting concomitant constipation. No free air. No soft tissue mass or abnormal calcification. Osseous structures demonstrate no acute finding. IMPRESSION: 1. Few mildly dilated gas-filled loops of small bowel within the upper mid and left abdomen with associated air-fluid levels, concerning for small bowel obstruction. Further assessment with cross-sectional imaging suggested. 2. Large volume retained stool within the nondilated colon, suggesting concomitant constipation. 3. Diffuse bronchitic changes, suspected to be related history of COPD. No other active cardiopulmonary disease. Electronically Signed   By: Rise MuBenjamin  McClintock M.D.   On: 11/12/2018 21:40     Medications:  Scheduled: . [START ON 11/14/2018] Influenza vac split quadrivalent PF  0.5 mL Intramuscular Tomorrow-1000   Continuous: . 0.9 % NaCl with KCl 20 mEq / L 125 mL/hr at 11/13/18 0042  . famotidine (PEPCID) IV 20 mg (11/13/18 [id number])   JXB:[id number]:acetaminophen **OR** acetaminophen, fentaNYL (SUBLIMAZE) injection, ondansetron **OR** ondansetron (ZOFRAN) IV    Assessment/Plan:  Small bowel obstruction Probably precipitated by severe constipation.  Patient was given an enema last night resulting in a large bowel movement.  Patient symptoms significantly improved after that.  He is passing  gas.  His abdomen is completely benign this morning.  NG tube was ordered yesterday but could not be placed despite multiple attempts.  Since he is feeling better we will advance his diet.  Hold off on surgical input.  Ambulate.  If he tolerates his diet he could be discharged tomorrow.  Constipation Resolved with enema.  Check  TSH.  History of COPD Stable respiratory status.  History of depression Okay to resume home medications once he is able to take orally.  Essential hypertension Well controlled.  Holding his oral agents.  History of gout Stable   DVT Prophylaxis: SCDs    Code Status: Full code Family Communication: Discussed with the patient Disposition Plan: Mobilize.  Management as outlined above.    LOS: 1 day   Osvaldo Shipper  [facility] Pager [phone] 11/13/2018, 2:44 PM  If 7PM-7AM, please contact night-coverage at [email or web address], password [facility]

## 2018-11-13 NOTE — Progress Notes (Signed)
Attempted ng tube placement x 2.unable to advance.  Pt requesting to not have ng tube.  Page out to [facility]

## 2018-11-14 LAB — CBC
HEMATOCRIT: 40.1 % (ref 39.0–52.0)
HEMOGLOBIN: 13 g/dL (ref 13.0–17.0)
MCH: 29.8 pg (ref 26.0–34.0)
MCHC: 32.4 g/dL (ref 30.0–36.0)
MCV: 92 fL (ref 80.0–100.0)
Platelets: 150 10*3/uL (ref 150–400)
RBC: 4.36 MIL/uL (ref 4.22–5.81)
RDW: 12.4 % (ref 11.5–15.5)
WBC: 8 10*3/uL (ref 4.0–10.5)
nRBC: 0 % (ref 0.0–0.2)

## 2018-11-14 LAB — BASIC METABOLIC PANEL
Anion gap: 4 — ABNORMAL LOW (ref 5–15)
BUN: 5 mg/dL — ABNORMAL LOW (ref 6–20)
CHLORIDE: 114 mmol/L — AB (ref 98–111)
CO2: 24 mmol/L (ref 22–32)
CREATININE: 0.81 mg/dL (ref 0.61–1.24)
Calcium: 8.4 mg/dL — ABNORMAL LOW (ref 8.9–10.3)
Glucose, Bld: 93 mg/dL (ref 70–99)
Potassium: 4 mmol/L (ref 3.5–5.1)
Sodium: 142 mmol/L (ref 135–145)

## 2018-11-14 LAB — TSH: TSH: 0.753 u[IU]/mL (ref 0.350–4.500)

## 2018-11-14 MED ORDER — POLYETHYLENE GLYCOL 3350 17 G PO PACK: 17.0000 g | PACK | Freq: Every day | ORAL | 0 refills | Status: AC

## 2018-11-14 MED ORDER — DOCUSATE SODIUM 100 MG PO CAPS: 100.0000 mg | ORAL_CAPSULE | Freq: Every day | ORAL | 2 refills | Status: AC | PRN

## 2018-11-14 NOTE — Discharge Instructions (Signed)
Constipation, Adult Constipation is when a person:  Poops (has a bowel movement) fewer times in a week than normal.  Has a hard time pooping.  Has poop that is dry, hard, or bigger than normal.  Follow these instructions at home: Eating and drinking   Eat foods that have a lot of fiber, such as: ? Fresh fruits and vegetables. ? Whole grains. ? Beans.  Eat less of foods that are high in fat, low in fiber, or overly processed, such as: ? [place] fries. ? Hamburgers. ? Cookies. ? Candy. ? Soda.  Drink enough fluid to keep your pee (urine) clear or pale yellow. General instructions  Exercise regularly or as told by your doctor.  Go to the restroom when you feel like you need to poop. Do not hold it in.  Take over-the-counter and prescription medicines only as told by your doctor. These include any fiber supplements.  Do pelvic floor retraining exercises, such as: ? Doing deep breathing while relaxing your lower belly (abdomen). ? Relaxing your pelvic floor while pooping.  Watch your condition for any changes.  Keep all follow-up visits as told by your doctor. This is important. Contact a doctor if:  You have pain that gets worse.  You have a fever.  You have not pooped for 4 days.  You throw up (vomit).  You are not hungry.  You lose weight.  You are bleeding from the anus.  You have thin, pencil-like poop (stool). Get help right away if:  You have a fever, and your symptoms suddenly get worse.  You leak poop or have blood in your poop.  Your belly feels hard or bigger than normal (is bloated).  You have very bad belly pain.  You feel dizzy or you faint. This information is not intended to replace advice given to you by your health care provider. Make sure you discuss any questions you have with your health care provider. Document Released: 05/22/2008 Document Revised: 06/23/2016 Document Reviewed: 05/24/2016 Elsevier Interactive Patient Education   2018 [employer]   Small Bowel Obstruction A small bowel obstruction means that something is blocking the small bowel. The small bowel is also called the small intestine. It is the long tube that connects the stomach to the colon. An obstruction will stop food and fluids from passing through the small bowel. Treatment depends on what is causing the problem and how bad the problem is. Follow these instructions at home:  Get a lot of rest.  Follow your diet as told by your doctor. You may need to: ? Only drink clear liquids until you start to get better. ? Avoid solid foods as told by your doctor.  Take over-the-counter and prescription medicines only as told by your doctor.  Keep all follow-up visits as told by your doctor. This is important. Contact a doctor if:  You have a fever.  You have chills. Get help right away if:  You have pain or cramps that get worse.  You throw up (vomit) blood.  You have a feeling of being sick to your stomach (nausea) that does not go away.  You cannot stop throwing up.  You cannot drink fluids.  You feel confused.  You feel dry or thirsty (dehydrated).  Your belly gets more bloated.  You feel weak or you pass out (faint). This information is not intended to replace advice given to you by your health care provider. Make sure you discuss any questions you have with your health care  provider. Document Released: 01/11/2005 Document Revised: 07/31/2016 Document Reviewed: 01/28/2015 Elsevier Interactive Patient Education  [employer].

## 2018-11-14 NOTE — Progress Notes (Signed)
Pts Iv removed with a clean and dry dressing intact. Pt denies pain at the time of d/c with no s/s of distress noted. Pt provided with a bus pass to go home.

## 2018-11-14 NOTE — Discharge Summary (Signed)
[facility]  Physician Discharge Summary   Patient ID: Gerald Jackson MRN: [id number] DOB/AGE: 42-Jan-1977 42 y.o.  Admit date: 11/12/2018 Discharge date: 11/14/2018  PCP: Lewis Moccasin, MD  DISCHARGE DIAGNOSES:  Small bowel obstruction, resolved Constipation, resolved History of COPD, stable History of depression, stable History of essential hypertension History of gout   RECOMMENDATIONS FOR OUTPATIENT FOLLOW UP: 1. Patient instructed to follow-up with his PCP within 1 week  DISCHARGE CONDITION: fair  Diet recommendation: As before  INITIAL HISTORY: 42 y.o.malewith medical history significant ofCOPD, depression, gout, hyperlipidemia, hypertension who presented to the emergency department with complaints of abdominal pain .  He also had an episode of vomiting.  CT scan raise concern for small bowel obstruction.  Patient mentioned being constipated for several days.  Patient was admitted to the hospital for further management.   HOSPITAL COURSE:   Small bowel obstruction Probably precipitated by severe constipation.  Patient was given an enema resulting in a large bowel movement.  Patient symptoms significantly improved after that.  He is passing gas.  His abdomen remains benign.  NG tube was ordered at admission but could not be placed despite multiple attempts.  Patient was started on a liquid diet which was advanced to soft.  He has had multiple bowel movements.  He has been ambulating.  He feels "100% better".  Patient told to eat a diet with more roughage in diet.  Avoid constipation as much as possible.  Constipation Resolved with enema.    TSH normal.   Bowel regimen prescribed.  History of COPD Stable respiratory status.  History of depression Resume home medication  Essential hypertension Continue home medications  History of gout Stable  HIV nonreactive.  Overall stable.  Patient feels much better.  Okay for discharge home  today.   PERTINENT LABS:  The results of significant diagnostics from this hospitalization (including imaging, microbiology, ancillary and laboratory) are listed below for reference.     Labs: Basic Metabolic Panel: Recent Labs  Lab 11/12/18 2132 11/12/18 2359 11/13/18 0636 11/14/18 0604  NA 141  --  142 142  K 3.8  --  3.9 4.0  CL 107  --  111 114*  CO2 26  --  25 24  GLUCOSE 99  --  93 93  BUN 6  --  6 5*  CREATININE 0.92  --  0.77 0.81  CALCIUM 9.1  --  8.6* 8.4*  MG  --  2.0  --   --   PHOS  --  3.5  --   --    Liver Function Tests: Recent Labs  Lab 11/12/18 2132 11/13/18 0636  AST 49* 35  ALT 25 22  ALKPHOS 38 32*  BILITOT 0.8 0.8  PROT 7.1 6.0*  ALBUMIN 4.4 3.6   Recent Labs  Lab 11/12/18 2132  LIPASE 20   CBC: Recent Labs  Lab 11/12/18 2132 11/13/18 0636 11/14/18 0604  WBC 12.2* 9.3 8.0  NEUTROABS 9.2* 5.7  --   HGB 14.7 13.5 13.0  HCT 43.9 40.7 40.1  MCV 89.6 91.3 92.0  PLT 182 149* 150    IMAGING STUDIES Ct Abdomen Pelvis W Contrast  Result Date: 11/12/2018 CLINICAL DATA:  Bowel obstruction, low grade. Abdominal pain. No bowel movement for 4-5 days. EXAM: CT ABDOMEN AND PELVIS WITH CONTRAST TECHNIQUE: Multidetector CT imaging of the abdomen and pelvis was performed using the standard protocol following bolus administration of intravenous contrast. CONTRAST:  ISOVUE-300 IOPAMIDOL (ISOVUE-300) INJECTION 61% COMPARISON:  Radiographs earlier this day. FINDINGS: Lower chest: Mild subpleural atelectasis or scarring at the lung bases. Trace right pleural effusion. Hepatobiliary: Subcapsular subdiaphragmatic subcentimeter cyst in the right lobe of the liver. No suspicious hepatic abnormality. Gallbladder partially distended. No calcified gallstone or pericholecystic inflammation. No biliary dilatation. Pancreas: No ductal dilatation or inflammation. Spleen: Normal in size without focal abnormality. Adrenals/Urinary Tract: Normal adrenal glands. No  hydronephrosis or perinephric edema. Homogeneous renal enhancement with symmetric excretion on delayed phase imaging. 9 mm hypodensity in the medial right kidney is too small to accurately characterize but likely cyst. Urinary bladder is physiologically distended without wall thickening. Stomach/Bowel: Small hiatal hernia. Mild distal esophageal wall thickening. Stomach physiologically distended. Normal positioning of the ligament of Treitz. Dilated fluid-filled small bowel in the left upper quadrant. Dilated small bowel fecalized contents in the left mid abdomen, with bowel loops are opposed to the anterior abdominal wall. Mild associated mesenteric edema small amount of adjacent free fluid fluid. Distal small bowel are nondilated, however a discrete transition point is difficult to delineate. Moderate volume of stool throughout the colon without colonic wall thickening or inflammatory change. Appendix is surgically absent. Vascular/Lymphatic: Mesenteric vessels, portal and splenic vein are patent. Normal caliber abdominal aorta. No enlarged abdominal or pelvic lymph nodes. Reproductive: Prostate is unremarkable. Other: Minimal free fluid in the pelvis is likely reactive. No free air. No intra-abdominal abscess. Musculoskeletal: There are no acute or suspicious osseous abnormalities. Incidental note of 4 non-rib-bearing lumbar vertebra with transitional lumbosacral anatomy. IMPRESSION: 1. Findings consistent with small bowel obstruction. A discrete transition point is difficult to delineate, however suspected in the left mid abdomen. This is likely secondary to adhesions. 2. Moderate stool burden throughout the colon suggesting constipation. Electronically Signed   By: Narda Rutherford M.D.   On: 11/12/2018 23:18   Dg Abd Acute W/chest  Result Date: 11/12/2018 CLINICAL DATA:  Initial evaluation for acute abdominal pain. EXAM: DG ABDOMEN ACUTE W/ 1V CHEST COMPARISON:  Prior radiograph from 04/18/2016. FINDINGS:  Cardiac and mediastinal silhouettes within normal limits. Lungs normally inflated. Scattered bronchitic changes, like related history of COPD. No focal infiltrates. No edema or effusion. No pneumothorax. Multiple prominent gas-filled loops of small bowel seen within the upper and left abdomen, measuring up to approximately 3.9 cm in diameter. Few air-fluid levels seen on upright projection. Paucity of gas distally. Findings concerning for possible small bowel obstruction. Large volume retained stool throughout the colon, suggesting concomitant constipation. No free air. No soft tissue mass or abnormal calcification. Osseous structures demonstrate no acute finding. IMPRESSION: 1. Few mildly dilated gas-filled loops of small bowel within the upper mid and left abdomen with associated air-fluid levels, concerning for small bowel obstruction. Further assessment with cross-sectional imaging suggested. 2. Large volume retained stool within the nondilated colon, suggesting concomitant constipation. 3. Diffuse bronchitic changes, suspected to be related history of COPD. No other active cardiopulmonary disease. Electronically Signed   By: Rise Mu M.D.   On: 11/12/2018 21:40    DISCHARGE EXAMINATION: Vitals:   11/13/18 0510 11/13/18 1356 11/13/18 1929 11/14/18 0443  BP: 112/84 119/83 109/75 114/77  Pulse: 60 65 (!) 57 (!) 59  Resp: 20 20 19 18   Temp: 97.9 F (36.6 C) 98.7 F (37.1 C) 98.5 F (36.9 C) 98.2 F (36.8 C)  TempSrc: Oral Oral Oral Oral  SpO2: 98% 97% 100% 98%  Height:       General appearance: alert, cooperative, appears stated age and no distress Resp: clear to auscultation bilaterally  Cardio: regular rate and rhythm, S1, S2 normal, no murmur, click, rub or gallop GI: soft, non-tender; bowel sounds normal; no masses,  no organomegaly  DISPOSITION: Home  Discharge Instructions    Call MD for:  extreme fatigue   Complete by:  As directed    Call MD for:  persistant dizziness  or light-headedness   Complete by:  As directed    Call MD for:  persistant nausea and vomiting   Complete by:  As directed    Call MD for:  severe uncontrolled pain   Complete by:  As directed    Call MD for:  temperature >100.4   Complete by:  As directed    Diet - low sodium heart healthy   Complete by:  As directed    Discharge instructions   Complete by:  As directed    Please avoid constipation.  Take stool softeners and laxatives.  Have regular bowel movements.  Eat a diet with more roughage in it.  Follow-up with your primary care provider within 1 week.  You were cared for by a hospitalist during your hospital stay. If you have any questions about your discharge medications or the care you received while you were in the hospital after you are discharged, you can call the unit and asked to speak with the hospitalist on call if the hospitalist that took care of you is not available. Once you are discharged, your primary care physician will handle any further medical issues. Please note that NO REFILLS for any discharge medications will be authorized once you are discharged, as it is imperative that you return to your primary care physician (or establish a relationship with a primary care physician if you do not have one) for your aftercare needs so that they can reassess your need for medications and monitor your lab values. If you do not have a primary care physician, you can call [phone] for a physician referral.   Increase activity slowly   Complete by:  As directed         Allergies as of 11/14/2018   No Known Allergies     Medication List    TAKE these medications   acetaminophen 325 MG tablet Commonly known as:  TYLENOL Take 650 mg by mouth every 4 (four) hours as needed for mild pain or headache.   allopurinol 100 MG tablet Commonly known as:  ZYLOPRIM Take 100 mg by mouth 2 (two) times daily.   amLODipine 10 MG tablet Commonly known as:  NORVASC Take 10 mg by  mouth daily.   atorvastatin 20 MG tablet Commonly known as:  LIPITOR Take 20 mg by mouth at bedtime.   benztropine 1 MG tablet Commonly known as:  COGENTIN Take 1.5 mg by mouth 2 (two) times daily.   buPROPion 150 MG 24 hr tablet Commonly known as:  WELLBUTRIN XL Take 150 mg by mouth daily.   docusate sodium 100 MG capsule Commonly known as:  COLACE Take 1 capsule (100 mg total) by mouth daily as needed.   fluticasone 50 MCG/ACT nasal spray Commonly known as:  FLONASE Place 2 sprays into both nostrils daily as needed for allergies or rhinitis.   GERI-LANTA 200-200-20 MG/5ML suspension Generic drug:  alum & mag hydroxide-simeth Take 30 mLs by mouth every 6 (six) hours as needed for indigestion or heartburn.   guaiFENesin 100 MG/5ML liquid Commonly known as:  ROBITUSSIN Take 200 mg by mouth every 6 (six) hours as needed for cough.  ibuprofen 200 MG tablet Commonly known as:  ADVIL,MOTRIN Take 400 mg by mouth every 6 (six) hours as needed for mild pain.   indomethacin 50 MG capsule Commonly known as:  INDOCIN Take 50 mg by mouth 3 (three) times daily as needed (gout flare ups).   INVEGA TRINZA 819 MG/2.625ML Susp Generic drug:  Paliperidone Palmitate Inject 1 Applicatorful into the muscle every 3 (three) months.   lisinopril 5 MG tablet Commonly known as:  PRINIVIL,ZESTRIL Take 5 mg by mouth daily.   loperamide 2 MG capsule Commonly known as:  IMODIUM Take 2 mg by mouth See admin instructions. Take 2 mg by mouth after each loose stool. Max of 3 doses in 12 hours. Notify MD if diarrhea persists past 12 hours   loxapine 10 MG capsule Commonly known as:  LOXITANE Take 20 mg by mouth at bedtime.   omeprazole 20 MG capsule Commonly known as:  PRILOSEC Take 20 mg by mouth daily.   polyethylene glycol packet Commonly known as:  MIRALAX / GLYCOLAX Take 17 g by mouth daily.   sertraline 100 MG tablet Commonly known as:  ZOLOFT Take 150 mg by mouth daily.     sodium phosphate 7-19 GM/118ML Enem Place 1 enema rectally See admin instructions. If no relief from milk of mag and prune juice, give 1 enema rectally as needed for constipation. Contact MD if not relieved. Notify MD if constipation is accompanied by severe abdominal pain   traZODone 150 MG tablet Commonly known as:  DESYREL Take 150 mg by mouth at bedtime.        Follow-up Information    Lewis Moccasin, MD. Schedule an appointment as soon as possible for a visit in 1 week(s).   Specialty:  Family Medicine Contact information: [address] [place] [address] [phone]           TOTAL DISCHARGE TIME: 35 minutes  Osvaldo Shipper  [facility] Pager [phone]  11/14/2018, 1:41 PM

## 2019-09-26 ENCOUNTER — Encounter (HOSPITAL_COMMUNITY): Payer: Self-pay | Admitting: *Deleted

## 2019-09-26 ENCOUNTER — Emergency Department (HOSPITAL_COMMUNITY): Payer: Medicare Other

## 2019-09-26 ENCOUNTER — Inpatient Hospital Stay (HOSPITAL_COMMUNITY)
Admission: EM | Admit: 2019-09-26 | Discharge: 2019-09-29 | DRG: 389 | Disposition: A | Discharge status: Home / Self Care | Payer: Medicare Other | Attending: Family Medicine | Admitting: Family Medicine

## 2019-09-26 ENCOUNTER — Other Ambulatory Visit: Payer: Self-pay

## 2019-09-26 DIAGNOSIS — F200 Paranoid schizophrenia: Secondary | ICD-10-CM | POA: Diagnosis present

## 2019-09-26 DIAGNOSIS — F17210 Nicotine dependence, cigarettes, uncomplicated: Secondary | ICD-10-CM | POA: Diagnosis present

## 2019-09-26 DIAGNOSIS — E876 Hypokalemia: Secondary | ICD-10-CM | POA: Diagnosis present

## 2019-09-26 DIAGNOSIS — Z20828 Contact with and (suspected) exposure to other viral communicable diseases: Secondary | ICD-10-CM | POA: Diagnosis present

## 2019-09-26 DIAGNOSIS — J449 Chronic obstructive pulmonary disease, unspecified: Secondary | ICD-10-CM | POA: Diagnosis present

## 2019-09-26 DIAGNOSIS — K219 Gastro-esophageal reflux disease without esophagitis: Secondary | ICD-10-CM | POA: Diagnosis present

## 2019-09-26 DIAGNOSIS — K5651 Intestinal adhesions [bands], with partial obstruction: Principal | ICD-10-CM | POA: Diagnosis present

## 2019-09-26 DIAGNOSIS — K5909 Other constipation: Secondary | ICD-10-CM

## 2019-09-26 DIAGNOSIS — Z79899 Other long term (current) drug therapy: Secondary | ICD-10-CM

## 2019-09-26 DIAGNOSIS — F1290 Cannabis use, unspecified, uncomplicated: Secondary | ICD-10-CM | POA: Diagnosis present

## 2019-09-26 DIAGNOSIS — F251 Schizoaffective disorder, depressive type: Secondary | ICD-10-CM | POA: Diagnosis present

## 2019-09-26 DIAGNOSIS — Z789 Other specified health status: Secondary | ICD-10-CM

## 2019-09-26 DIAGNOSIS — K56609 Unspecified intestinal obstruction, unspecified as to partial versus complete obstruction: Secondary | ICD-10-CM | POA: Diagnosis present

## 2019-09-26 DIAGNOSIS — J189 Pneumonia, unspecified organism: Secondary | ICD-10-CM

## 2019-09-26 DIAGNOSIS — I739 Peripheral vascular disease, unspecified: Secondary | ICD-10-CM | POA: Diagnosis present

## 2019-09-26 DIAGNOSIS — Z7289 Other problems related to lifestyle: Secondary | ICD-10-CM

## 2019-09-26 DIAGNOSIS — M109 Gout, unspecified: Secondary | ICD-10-CM | POA: Diagnosis present

## 2019-09-26 DIAGNOSIS — R112 Nausea with vomiting, unspecified: Secondary | ICD-10-CM

## 2019-09-26 DIAGNOSIS — I10 Essential (primary) hypertension: Secondary | ICD-10-CM | POA: Diagnosis present

## 2019-09-26 DIAGNOSIS — J309 Allergic rhinitis, unspecified: Secondary | ICD-10-CM | POA: Diagnosis present

## 2019-09-26 DIAGNOSIS — E785 Hyperlipidemia, unspecified: Secondary | ICD-10-CM | POA: Diagnosis present

## 2019-09-26 DIAGNOSIS — K449 Diaphragmatic hernia without obstruction or gangrene: Secondary | ICD-10-CM | POA: Diagnosis present

## 2019-09-26 DIAGNOSIS — R1084 Generalized abdominal pain: Secondary | ICD-10-CM

## 2019-09-26 DIAGNOSIS — Z8719 Personal history of other diseases of the digestive system: Secondary | ICD-10-CM

## 2019-09-26 DIAGNOSIS — Z9049 Acquired absence of other specified parts of digestive tract: Secondary | ICD-10-CM

## 2019-09-26 DIAGNOSIS — F331 Major depressive disorder, recurrent, moderate: Secondary | ICD-10-CM | POA: Diagnosis present

## 2019-09-26 LAB — COMPREHENSIVE METABOLIC PANEL
ALT: 14 U/L (ref 0–44)
AST: 14 U/L — ABNORMAL LOW (ref 15–41)
Albumin: 5.1 g/dL — ABNORMAL HIGH (ref 3.5–5.0)
Alkaline Phosphatase: 40 U/L (ref 38–126)
Anion gap: 10 (ref 5–15)
BUN: 8 mg/dL (ref 6–20)
CO2: 29 mmol/L (ref 22–32)
Calcium: 9.7 mg/dL (ref 8.9–10.3)
Chloride: 101 mmol/L (ref 98–111)
Creatinine, Ser: 0.99 mg/dL (ref 0.61–1.24)
GFR calc Af Amer: >60 mL/min (ref >60)
GFR calc non Af Amer: >60 mL/min (ref >60)
Glucose, Bld: 116 mg/dL — ABNORMAL HIGH (ref 70–99)
Potassium: 3.9 mmol/L (ref 3.5–5.1)
Sodium: 140 mmol/L (ref 135–145)
Total Bilirubin: 0.7 mg/dL (ref 0.3–1.2)
Total Protein: 8 g/dL (ref 6.5–8.1)

## 2019-09-26 LAB — URINALYSIS, ROUTINE W REFLEX MICROSCOPIC
Bilirubin Urine: NEGATIVE
Glucose, UA: NEGATIVE mg/dL
Hgb urine dipstick: NEGATIVE
Ketones, ur: NEGATIVE mg/dL
Leukocytes,Ua: NEGATIVE
Nitrite: NEGATIVE
Protein, ur: NEGATIVE mg/dL
Specific Gravity, Urine: 1.011 (ref 1.005–1.030)
pH: 5 (ref 5.0–8.0)

## 2019-09-26 LAB — CBC
HCT: 48.3 % (ref 39.0–52.0)
Hemoglobin: 16.3 g/dL (ref 13.0–17.0)
MCH: 30.3 pg (ref 26.0–34.0)
MCHC: 33.7 g/dL (ref 30.0–36.0)
MCV: 89.8 fL (ref 80.0–100.0)
Platelets: 192 10*3/uL (ref 150–400)
RBC: 5.38 MIL/uL (ref 4.22–5.81)
RDW: 12.4 % (ref 11.5–15.5)
WBC: 13.8 10*3/uL — ABNORMAL HIGH (ref 4.0–10.5)
nRBC: 0 % (ref 0.0–0.2)

## 2019-09-26 LAB — LIPASE, BLOOD: Lipase: 18 U/L (ref 11–51)

## 2019-09-26 MED ORDER — SODIUM CHLORIDE 0.9 % IV BOLUS
1000.0000 mL | Freq: Once | INTRAVENOUS | Status: AC
  Administered 2019-09-26: 1000 mL via INTRAVENOUS

## 2019-09-26 MED ORDER — ONDANSETRON HCL 4 MG/2ML IJ SOLN
4.0000 mg | Freq: Once | INTRAMUSCULAR | Status: AC
  Administered 2019-09-26: 4 mg via INTRAVENOUS
  Filled 2019-09-26: qty 2

## 2019-09-26 MED ORDER — IOHEXOL 300 MG/ML  SOLN
100.0000 mL | Freq: Once | INTRAMUSCULAR | Status: AC | PRN
  Administered 2019-09-26: 100 mL via INTRAVENOUS

## 2019-09-26 MED ORDER — SODIUM CHLORIDE (PF) 0.9 % IJ SOLN
INTRAMUSCULAR | Status: AC
  Filled 2019-09-26: qty 50

## 2019-09-26 MED ORDER — MORPHINE SULFATE (PF) 4 MG/ML IV SOLN
4.0000 mg | Freq: Once | INTRAVENOUS | Status: AC
  Administered 2019-09-26: 4 mg via INTRAVENOUS
  Filled 2019-09-26: qty 1

## 2019-09-26 MED ORDER — SODIUM CHLORIDE 0.9% FLUSH: 3.0000 mL | Freq: Once | INTRAVENOUS | Status: DC

## 2019-09-26 NOTE — ED Notes (Signed)
Pt ambulatory from triage 

## 2019-09-26 NOTE — ED Triage Notes (Signed)
Pt arrives via EMS from group home. Abdominal pain (sharp stabbing) with n/v. Emesis x 2 en route. Reporting 1 beer and marijuana use this morning. 122/80, hr 96, r 16, 99% RA, cbg 129, temp 98.1.

## 2019-09-26 NOTE — ED Triage Notes (Signed)
Pt reporting upper abdominal pain that radiates to his mid back area that started this morning. He has had multiple episodes of vomiting and 1 episode of diarrhea.

## 2019-09-26 NOTE — ED Provider Notes (Signed)
[facility]-EMERGENCY DEPT Provider Note   CSN: [id number] Arrival date & time: 09/26/19  2018     History   Chief Complaint Chief Complaint  Patient presents with  . Abdominal Pain    HPI Gerald Jackson is a 43 y.o. male with a hx of COPD, Depression, HTN, hyperlipidemia, SBO (2019 managed medically) presents to the Emergency Department complaining of gradual, persistent, constant and progressively worsening generalized abd pain onset 8am. Pt reports his pain is sharp/stabbing 10/10 without radiation (clarified as triage note reports radiation to the back - pt denies this).  Associated symptoms include nausea and vomiting.  Pt reports 3 episode of NBNB emesis.  Last BM today around 4pm and normal.  He reports at that time he was passing gas, but has not since that time. No treatments PTA.  Pt reports nothing seems to make the pain better or worse.  Pt reports subjective fevers and chills, cough and fatigue with unknown onset.  Pt denies fever, chills, headache, neck pain, chest pain, SOB.  Pt reports hx of appendectomy, denies any other abd surgeries.  Pt reports intermittent EtOH usage, last 1 large beer was "earlier today."  Pt reports smoking marijuana x1 today.  He denies regular usage.  Both of these occurred before he developed pain. Pt reports living in a group home.         The history is provided by the patient and medical records. No language interpreter was used.    Past Medical History:  Diagnosis Date  . COPD (chronic obstructive pulmonary disease) (HCC)   . Depression   . Gout 11/12/2018  . Hyperlipidemia 11/12/2018  . Hypertension 11/12/2018    Patient Active Problem List   Diagnosis Date Noted  . SBO (small bowel obstruction) (HCC) 11/12/2018  . Hyperlipidemia 11/12/2018  . COPD (chronic obstructive pulmonary disease) (HCC) 11/12/2018  . Depression 11/12/2018  . Hypertension 11/12/2018  . Gout 11/12/2018    Past Surgical History:   Procedure Laterality Date  . APPENDECTOMY          Home Medications    Prior to Admission medications   Medication Sig Start Date End Date Taking? Authorizing Provider  acetaminophen (TYLENOL) 325 MG tablet Take 650 mg by mouth every 4 (four) hours as needed for mild pain or headache.   Yes [provider]  allopurinol (ZYLOPRIM) 100 MG tablet Take 100 mg by mouth 2 (two) times daily. 10/18/18  Yes [provider]  alum & mag hydroxide-simeth (GERI-LANTA) 200-200-20 MG/5ML suspension Take 30 mLs by mouth every 6 (six) hours as needed for indigestion or heartburn.   Yes [provider]  amLODipine (NORVASC) 10 MG tablet Take 10 mg by mouth daily. 10/18/18  Yes [provider]  atorvastatin (LIPITOR) 20 MG tablet Take 20 mg by mouth at bedtime.  10/18/18  Yes [provider]  benztropine (COGENTIN) 1 MG tablet Take 1.5 mg by mouth 2 (two) times daily.    Yes [provider]  buPROPion (WELLBUTRIN XL) 150 MG 24 hr tablet Take 150 mg by mouth daily. 10/18/18  Yes [provider]  docusate sodium (COLACE) 100 MG capsule Take 1 capsule (100 mg total) by mouth daily as needed. 11/14/18 11/14/19 Yes Osvaldo ShipperKrishnan, Gokul, MD  guaiFENesin (ROBITUSSIN) 100 MG/5ML liquid Take 200 mg by mouth every 6 (six) hours as needed for cough.   Yes [provider]  ibuprofen (ADVIL,MOTRIN) 200 MG tablet Take 400 mg by mouth every 6 (six) hours  as needed for mild pain.   Yes [provider]  lisinopril (PRINIVIL,ZESTRIL) 5 MG tablet Take 5 mg by mouth daily. 10/18/18  Yes [provider]  loperamide (IMODIUM) 2 MG capsule Take 2 mg by mouth See admin instructions. Take 2 mg by mouth after each loose stool. Max of 3 doses in 12 hours. Notify MD if diarrhea persists past 12 hours   Yes [provider]  loxapine (LOXITANE) 10 MG capsule Take 20 mg by mouth at bedtime. 09/23/18  Yes [provider]  omeprazole (PRILOSEC) 20  MG capsule Take 20 mg by mouth daily. 10/18/18  Yes [provider]  Paliperidone Palmitate (INVEGA TRINZA) 819 MG/2.625ML SUSP Inject 1 Applicatorful into the muscle every 3 (three) months.   Yes [provider]  polyethylene glycol (MIRALAX / GLYCOLAX) packet Take 17 g by mouth daily. 11/14/18  Yes Osvaldo Shipper, MD  sertraline (ZOLOFT) 100 MG tablet Take 150 mg by mouth daily.    Yes [provider]  sodium phosphate (FLEET) 7-19 GM/118ML ENEM Place 1 enema rectally See admin instructions. If no relief from milk of mag and prune juice, give 1 enema rectally as needed for constipation. Contact MD if not relieved. Notify MD if constipation is accompanied by severe abdominal pain   Yes [provider]  traZODone (DESYREL) 150 MG tablet Take 150 mg by mouth at bedtime.   Yes [provider]    Family History No family history on file.  Social History Social History   Tobacco Use  . Smoking status: Current Some Day Smoker    Packs/day: 0.50    Types: Cigarettes  . Smokeless tobacco: Never Used  Substance Use Topics  . Alcohol use: Yes    Comment: occasionally  . Drug use: Yes    Types: Marijuana     Allergies   Patient has no known allergies.   Review of Systems Review of Systems  Constitutional: Positive for fever ( subjective). Negative for appetite change, diaphoresis, fatigue and unexpected weight change.  HENT: Negative for mouth sores.   Eyes: Negative for visual disturbance.  Respiratory: Positive for cough. Negative for chest tightness, shortness of breath and wheezing.   Cardiovascular: Negative for chest pain.  Gastrointestinal: Positive for abdominal pain, nausea and vomiting. Negative for constipation and diarrhea.  Endocrine: Negative for polydipsia, polyphagia and polyuria.  Genitourinary: Negative for dysuria, frequency, hematuria and urgency.  Musculoskeletal: Negative for back pain and neck stiffness.  Skin:  Negative for rash.  Allergic/Immunologic: Negative for immunocompromised state.  Neurological: Negative for syncope, light-headedness and headaches.  Hematological: Does not bruise/bleed easily.  Psychiatric/Behavioral: Negative for sleep disturbance. The patient is not nervous/anxious.      Physical Exam Updated Vital Signs BP 136/89   Pulse 74   Temp 98.8 F (37.1 C) (Oral)   Resp 16   Ht  (1.854 m)   Wt 98.4 kg   SpO2 100%   BMI 28.63 kg/m   Physical Exam Vitals signs and nursing note reviewed.  Constitutional:      General: He is not in acute distress.    Appearance: He is not diaphoretic.  HENT:     Head: Normocephalic.  Eyes:     General: No scleral icterus.    Conjunctiva/sclera: Conjunctivae normal.  Neck:     Musculoskeletal: Normal range of motion.  Cardiovascular:     Rate and Rhythm: Normal rate and regular rhythm.     Pulses: Normal pulses.  Radial pulses are 2+ on the right side and 2+ on the left side.  Pulmonary:     Effort: No tachypnea, accessory muscle usage, prolonged expiration, respiratory distress or retractions.     Breath sounds: No stridor.     Comments: Equal chest rise. No increased work of breathing. Abdominal:     General: There is no distension.     Palpations: Abdomen is soft.     Tenderness: There is generalized abdominal tenderness. There is no guarding or rebound.     Comments: Significant midline scarring  Musculoskeletal:     Comments: Moves all extremities equally and without difficulty.  Skin:    General: Skin is warm and dry.     Capillary Refill: Capillary refill takes less than 2 seconds.  Neurological:     Mental Status: He is alert.     GCS: GCS eye subscore is 4. GCS verbal subscore is 5. GCS motor subscore is 6.     Comments: Speech is clear and goal oriented.  Psychiatric:        Mood and Affect: Mood normal.      ED Treatments / Results  Labs (all labs ordered are listed, but only abnormal  results are displayed) Labs Reviewed  COMPREHENSIVE METABOLIC PANEL - Abnormal; Notable for the following components:      Result Value   Glucose, Bld 116 (*)    Albumin 5.1 (*)    AST 14 (*)    All other components within normal limits  CBC - Abnormal; Notable for the following components:   WBC 13.8 (*)    All other components within normal limits  SARS CORONAVIRUS 2 BY RT PCR (HOSPITAL ORDER, [place] [facility])  LIPASE, BLOOD  URINALYSIS, ROUTINE W REFLEX MICROSCOPIC  ETHANOL  RAPID URINE DRUG SCREEN, HOSP PERFORMED    EKG EKG Interpretation  Date/Time:  Friday September 26 2019 20:30:38 EDT Ventricular Rate:  97 PR Interval:    QRS Duration: 80 QT Interval:  329 QTC Calculation: 418 R Axis:   15 Text Interpretation:  Sinus rhythm Posterior infarct, old Borderline T abnormalities, diffuse leads When compared with ECG of 04/18/2016, No significant change was found Confirmed by Delora Fuel ([id number]) on 09/27/2019 1:16:52 AM     Radiology Ct Abdomen Pelvis W Contrast  Result Date: 09/27/2019 CLINICAL DATA:  Abdominal pain, vomiting, history of prior small bowel obstruction EXAM: CT ABDOMEN AND PELVIS WITH CONTRAST TECHNIQUE: Multidetector CT imaging of the abdomen and pelvis was performed using the standard protocol following bolus administration of intravenous contrast. CONTRAST:  176mL OMNIPAQUE IOHEXOL 300 MG/ML  SOLN COMPARISON:  CT abdomen pelvis 11/12/2018 FINDINGS: Lower chest: Bibasilar atelectasis. Normal heart size. No pericardial effusion. Hepatobiliary: Stable subcentimeter hypoattenuating focus in the posterior right lobe liver (2/15). Too small to fully characterize on CT imaging but statistically likely benign. No concerning liver abnormality is seen. No gallstones, gallbladder wall thickening, or biliary dilatation. Pancreas: Mild pancreatic atrophy. No ductal dilatation or inflammation. Spleen: Normal in size without focal abnormality.  Adrenals/Urinary Tract: Adrenal glands are unremarkable. Subcentimeter hypoattenuating focus in the interpolar right kidney is unchanged from prior, too small to fully characterize though likely a benign cyst. Kidneys are otherwise unremarkable, without renal calculi, suspicious lesion, or hydronephrosis. Bladder is unremarkable. Stomach/Bowel: Small hiatal hernia. Stomach and duodenal sweep are unremarkable. There are loops air and fluid distended small bowel in the left upper quadrant. More dilated portions of the small bowel demonstrate fecalized contents/80 small bowels  feces sign. There is mild associated mesenteric edema with slight twisting and displacement to the left upper quadrant. A proximal transition point is seen on axial image 2/39 in the upper midline abdomen. Distal transition point is noted at the level of the umbilicus (2/60) where a portion of the small bowel partially protrudes into a ventral diastasis. No pneumatosis or venous gas. More distal small bowel is decompressed. A normal appendix is visualized. No colonic dilatation or wall thickening. Vascular/Lymphatic: The aorta is normal caliber. No suspicious or enlarged lymph nodes in the included lymphatic chains. Reproductive: The prostate and seminal vesicles are unremarkable. Other: No abdominopelvic free fluid or free gas. Portion of the small bowel is closely apposed to a ventral diastasis at the level of the umbilicus. Musculoskeletal: Transitional lumbosacral anatomy with only 4 lumbar type vertebral bodies. No acute osseous abnormality or suspicious osseous lesion. IMPRESSION: Small-bowel obstruction with proximal transition point in the upper midline abdomen and a distal transition point at the level of the umbilicus where a portion of the small bowel partially protrudes into a ventral diastasis. Minimal mesenteric stranding. No pneumatosis or portal venous gas. Small hiatal hernia. Electronically Signed   By: Kreg Shropshire M.D.   On:  09/27/2019 00:27   Dg Chest Port 1 View  Result Date: 09/26/2019 CLINICAL DATA:  Cough, abdominal pain EXAM: PORTABLE CHEST 1 VIEW COMPARISON:  Radiograph Apr 18, 2016 FINDINGS: Lung volumes are low with streaky basilar areas of opacity. No convincing features of edema, pneumothorax, or effusion. Pulmonary vascularity is normally distributed. The cardiomediastinal contours are unremarkable. No acute osseous or soft tissue abnormality. IMPRESSION: Low lung volumes with streaky basilar areas of opacity, favored to reflect atelectasis in the absence of infectious clinical picture. Electronically Signed   By: Kreg Shropshire M.D.   On: 09/26/2019 22:47    Procedures Procedures (including critical care time)  Medications Ordered in ED Medications  sodium chloride 0.9 % bolus 1,000 mL (1,000 mLs Intravenous New Bag/Given (Non-Interop) 09/26/19 2322)  ondansetron (ZOFRAN) injection 4 mg (4 mg Intravenous Given 09/26/19 2322)  morphine 4 MG/ML injection 4 mg (4 mg Intravenous Given 09/26/19 2324)  iohexol (OMNIPAQUE) 300 MG/ML solution 100 mL (100 mLs Intravenous Contrast Given 09/26/19 2351)     Initial Impression / Assessment and Plan / ED Course  I have reviewed the triage vital signs and the nursing notes.  Pertinent labs & imaging results that were available during my care of the patient were reviewed by me and considered in my medical decision making (see chart for details).  Clinical Course as of Sep 27 119  Caleen Essex Sep 26, 2019  2230 Tachycardic on arrival  Pulse Rate(!): 105 [HM]  Sat Sep 27, 2019  0112 noted  WBC(!): 13.8 [HM]  0113 SBO noted  CT ABDOMEN PELVIS W CONTRAST [HM]  0118 Discussed with Dr. Ranell Patrick who will admit to triad.     [HM]    Clinical Course User Index [HM] Dream Harman, Dahlia Client, [place]        Patient presents with generalized abdominal pain, nausea and vomiting.  Records reviewed and patient does have a history of small bowel obstruction in 2019 which was treated  medically.  He is a poor historian with poor insight and has difficulty categorizing questions.  It does appear that he has had some intermittent subjective fevers and chills along with a cough.  Some concern about potential COVID, though less likely at this time.  Labs and imaging pending.  Symptomatic treatment given.  1:17 AM CT with SBO.  Will place NG tube and admit to medicine.  Patient's abdomen remains soft.  Pain is well controlled at this time. Triad to admit.     Final Clinical Impressions(s) / ED Diagnoses   Final diagnoses:  SBO (small bowel obstruction) (HCC)  Generalized abdominal pain  Intractable vomiting with nausea, unspecified vomiting type    ED Discharge Orders    None       Mardene Sayer Boyd Kerbs 09/27/19 Elliot Dally, MD 09/28/19 2213

## 2019-09-27 ENCOUNTER — Encounter (HOSPITAL_COMMUNITY): Payer: Self-pay | Admitting: Surgery

## 2019-09-27 ENCOUNTER — Inpatient Hospital Stay (HOSPITAL_COMMUNITY): Payer: Medicare Other

## 2019-09-27 ENCOUNTER — Emergency Department (HOSPITAL_COMMUNITY): Payer: Medicare Other

## 2019-09-27 DIAGNOSIS — F1290 Cannabis use, unspecified, uncomplicated: Secondary | ICD-10-CM

## 2019-09-27 DIAGNOSIS — K219 Gastro-esophageal reflux disease without esophagitis: Secondary | ICD-10-CM | POA: Diagnosis present

## 2019-09-27 DIAGNOSIS — F331 Major depressive disorder, recurrent, moderate: Secondary | ICD-10-CM | POA: Diagnosis present

## 2019-09-27 DIAGNOSIS — M109 Gout, unspecified: Secondary | ICD-10-CM | POA: Diagnosis present

## 2019-09-27 DIAGNOSIS — I10 Essential (primary) hypertension: Secondary | ICD-10-CM

## 2019-09-27 DIAGNOSIS — Z9049 Acquired absence of other specified parts of digestive tract: Secondary | ICD-10-CM | POA: Diagnosis not present

## 2019-09-27 DIAGNOSIS — J309 Allergic rhinitis, unspecified: Secondary | ICD-10-CM | POA: Diagnosis present

## 2019-09-27 DIAGNOSIS — I739 Peripheral vascular disease, unspecified: Secondary | ICD-10-CM | POA: Diagnosis present

## 2019-09-27 DIAGNOSIS — E785 Hyperlipidemia, unspecified: Secondary | ICD-10-CM | POA: Diagnosis present

## 2019-09-27 DIAGNOSIS — F17210 Nicotine dependence, cigarettes, uncomplicated: Secondary | ICD-10-CM | POA: Diagnosis present

## 2019-09-27 DIAGNOSIS — F251 Schizoaffective disorder, depressive type: Secondary | ICD-10-CM | POA: Diagnosis present

## 2019-09-27 DIAGNOSIS — K56609 Unspecified intestinal obstruction, unspecified as to partial versus complete obstruction: Secondary | ICD-10-CM | POA: Diagnosis not present

## 2019-09-27 DIAGNOSIS — Z20828 Contact with and (suspected) exposure to other viral communicable diseases: Secondary | ICD-10-CM | POA: Diagnosis present

## 2019-09-27 DIAGNOSIS — Z789 Other specified health status: Secondary | ICD-10-CM

## 2019-09-27 DIAGNOSIS — K449 Diaphragmatic hernia without obstruction or gangrene: Secondary | ICD-10-CM | POA: Diagnosis present

## 2019-09-27 DIAGNOSIS — M1A00X0 Idiopathic chronic gout, unspecified site, without tophus (tophi): Secondary | ICD-10-CM

## 2019-09-27 DIAGNOSIS — Z79899 Other long term (current) drug therapy: Secondary | ICD-10-CM | POA: Diagnosis not present

## 2019-09-27 DIAGNOSIS — K5651 Intestinal adhesions [bands], with partial obstruction: Secondary | ICD-10-CM | POA: Diagnosis present

## 2019-09-27 DIAGNOSIS — E876 Hypokalemia: Secondary | ICD-10-CM | POA: Diagnosis present

## 2019-09-27 DIAGNOSIS — B353 Tinea pedis: Secondary | ICD-10-CM | POA: Insufficient documentation

## 2019-09-27 DIAGNOSIS — M79671 Pain in right foot: Secondary | ICD-10-CM | POA: Insufficient documentation

## 2019-09-27 DIAGNOSIS — J449 Chronic obstructive pulmonary disease, unspecified: Secondary | ICD-10-CM | POA: Diagnosis present

## 2019-09-27 DIAGNOSIS — Z7289 Other problems related to lifestyle: Secondary | ICD-10-CM

## 2019-09-27 DIAGNOSIS — Z8719 Personal history of other diseases of the digestive system: Secondary | ICD-10-CM | POA: Diagnosis not present

## 2019-09-27 DIAGNOSIS — M79609 Pain in unspecified limb: Secondary | ICD-10-CM | POA: Insufficient documentation

## 2019-09-27 DIAGNOSIS — L853 Xerosis cutis: Secondary | ICD-10-CM | POA: Insufficient documentation

## 2019-09-27 DIAGNOSIS — F200 Paranoid schizophrenia: Secondary | ICD-10-CM

## 2019-09-27 DIAGNOSIS — L600 Ingrowing nail: Secondary | ICD-10-CM | POA: Insufficient documentation

## 2019-09-27 DIAGNOSIS — K5909 Other constipation: Secondary | ICD-10-CM

## 2019-09-27 HISTORY — DX: Allergic rhinitis, unspecified: J30.9

## 2019-09-27 HISTORY — DX: Paranoid schizophrenia: F20.0

## 2019-09-27 HISTORY — DX: Major depressive disorder, recurrent, moderate: F33.1

## 2019-09-27 HISTORY — DX: Schizoaffective disorder, depressive type: F25.1

## 2019-09-27 HISTORY — DX: Diaphragmatic hernia without obstruction or gangrene: K44.9

## 2019-09-27 HISTORY — DX: Other constipation: K59.09

## 2019-09-27 LAB — ETHANOL: Alcohol, Ethyl (B): <10 mg/dL (ref <10)

## 2019-09-27 LAB — SARS CORONAVIRUS 2 BY RT PCR (HOSPITAL ORDER, PERFORMED IN ~~LOC~~ HOSPITAL LAB): SARS Coronavirus 2: NEGATIVE

## 2019-09-27 MED ORDER — MENTHOL 3 MG MT LOZG: 1.0000 | LOZENGE | OROMUCOSAL | Status: DC | PRN

## 2019-09-27 MED ORDER — BISACODYL 10 MG RE SUPP: 10.0000 mg | Freq: Two times a day (BID) | RECTAL | Status: DC | PRN

## 2019-09-27 MED ORDER — ENOXAPARIN SODIUM 40 MG/0.4ML ~~LOC~~ SOLN
40.0000 mg | SUBCUTANEOUS | Status: DC
  Administered 2019-09-27 – 2019-09-29 (×3): 40 mg via SUBCUTANEOUS
  Filled 2019-09-27 (×3): qty 0.4

## 2019-09-27 MED ORDER — DIATRIZOATE MEGLUMINE & SODIUM 66-10 % PO SOLN
90.0000 mL | Freq: Once | ORAL | Status: AC
  Administered 2019-09-27: 90 mL via NASOGASTRIC
  Filled 2019-09-27: qty 90

## 2019-09-27 MED ORDER — SODIUM CHLORIDE 0.9 % IV SOLN
8.0000 mg | Freq: Four times a day (QID) | INTRAVENOUS | Status: DC | PRN
  Filled 2019-09-27: qty 4

## 2019-09-27 MED ORDER — LACTATED RINGERS IV BOLUS: 1000.0000 mL | Freq: Three times a day (TID) | INTRAVENOUS | Status: AC | PRN

## 2019-09-27 MED ORDER — GUAIFENESIN 100 MG/5ML PO SOLN: 200.0000 mg | Freq: Four times a day (QID) | ORAL | Status: DC | PRN

## 2019-09-27 MED ORDER — METOCLOPRAMIDE HCL 5 MG/ML IJ SOLN: 5.0000 mg | Freq: Three times a day (TID) | INTRAMUSCULAR | Status: DC | PRN

## 2019-09-27 MED ORDER — LIP MEDEX EX OINT
1.0000 "application " | TOPICAL_OINTMENT | Freq: Two times a day (BID) | CUTANEOUS | Status: DC
  Administered 2019-09-27 – 2019-09-29 (×4): 1 via TOPICAL
  Filled 2019-09-27 (×2): qty 7

## 2019-09-27 MED ORDER — HYDROCORTISONE (PERIANAL) 2.5 % EX CREA
1.0000 "application " | TOPICAL_CREAM | Freq: Four times a day (QID) | CUTANEOUS | Status: DC | PRN
  Filled 2019-09-27: qty 28.35

## 2019-09-27 MED ORDER — MORPHINE SULFATE (PF) 2 MG/ML IV SOLN
2.0000 mg | Freq: Once | INTRAVENOUS | Status: AC
  Administered 2019-09-27: 2 mg via INTRAVENOUS
  Filled 2019-09-27: qty 1

## 2019-09-27 MED ORDER — ALUM & MAG HYDROXIDE-SIMETH 200-200-20 MG/5ML PO SUSP: 30.0000 mL | Freq: Four times a day (QID) | ORAL | Status: DC | PRN

## 2019-09-27 MED ORDER — FLEET ENEMA 7-19 GM/118ML RE ENEM: 1.0000 | ENEMA | RECTAL | Status: DC | PRN

## 2019-09-27 MED ORDER — HYDROCORTISONE 1 % EX CREA
1.0000 "application " | TOPICAL_CREAM | Freq: Three times a day (TID) | CUTANEOUS | Status: DC | PRN
  Filled 2019-09-27: qty 28

## 2019-09-27 MED ORDER — BENZTROPINE MESYLATE 0.5 MG PO TABS: 1.5000 mg | ORAL_TABLET | Freq: Two times a day (BID) | ORAL | Status: DC

## 2019-09-27 MED ORDER — PHENOL 1.4 % MT LIQD
1.0000 | OROMUCOSAL | Status: DC | PRN
  Filled 2019-09-27: qty 177

## 2019-09-27 MED ORDER — MAGIC MOUTHWASH
15.0000 mL | Freq: Four times a day (QID) | ORAL | Status: DC | PRN
  Filled 2019-09-27: qty 15

## 2019-09-27 MED ORDER — ONDANSETRON HCL 4 MG/2ML IJ SOLN: 4.0000 mg | Freq: Four times a day (QID) | INTRAMUSCULAR | Status: DC | PRN

## 2019-09-27 MED ORDER — DEXTROSE-NACL 5-0.45 % IV SOLN
INTRAVENOUS | Status: DC
  Administered 2019-09-27: 06:00:00 via INTRAVENOUS

## 2019-09-27 MED ORDER — PROCHLORPERAZINE EDISYLATE 10 MG/2ML IJ SOLN: 5.0000 mg | INTRAMUSCULAR | Status: DC | PRN

## 2019-09-27 NOTE — ED Notes (Signed)
ED TO INPATIENT HANDOFF REPORT  ED Nurse Name and Phone #: jon wled   S Name/Age/Gender Gerald Jackson 43 y.o. male Room/Bed: WA15/WA15  Code Status   Code Status: Full Code  Home/SNF/Other Home {Patient oriented  Is this baseline? yes  Triage Complete: Triage complete  Chief Complaint Abd. Pain Nausea and Emesis  Triage Note Pt arrives via EMS from group home. Abdominal pain (sharp stabbing) with n/v. Emesis x 2 en route. Reporting 1 beer and marijuana use this morning. 122/80, hr 96, r 16, 99% RA, cbg 129, temp 98.1.   Pt reporting upper abdominal pain that radiates to his mid back area that started this morning. He has had multiple episodes of vomiting and 1 episode of diarrhea.    Allergies No Known Allergies  Level of Care/Admitting Diagnosis ED Disposition    ED Disposition Condition Comment   Admit  Hospital Area: [facility]  HOSPITAL [[id number]]  Level of Care: Med-Surg [16]  Covid Evaluation: Asymptomatic Screening Protocol (No Symptoms)  Diagnosis: SBO (small bowel obstruction) (HCC) [300762]  Admitting Physician: Jacques Navy [[id number]]  Attending Physician: Illene Regulus E [[id number]]  Estimated length of stay: past midnight tomorrow  Certification:: I certify this patient will need inpatient services for at least 2 midnights  PT Class (Do Not Modify): Inpatient [101]  PT Acc Code (Do Not Modify): Private [1]       B Medical/Surgery History Past Medical History:  Diagnosis Date  . COPD (chronic obstructive pulmonary disease) (HCC)   . Depression   . Gout 11/12/2018  . Hyperlipidemia 11/12/2018  . Hypertension 11/12/2018   Past Surgical History:  Procedure Laterality Date  . APPENDECTOMY       A IV Location/Drains/Wounds Patient Lines/Drains/Airways Status   Active Line/Drains/Airways    Name:   Placement date:   Placement time:   Site:   Days:   Peripheral IV 09/26/19 Left Antecubital   09/26/19    2321    Antecubital   1   NG/OG  Tube Nasogastric 14 Fr. Right nare Aucultation Measured external length of tube   09/27/19    0204    Right nare   less than 1          Intake/Output Last 24 hours  Intake/Output Summary (Last 24 hours) at 09/27/2019 0500 Last data filed at 09/27/2019 0145 Gross per 24 hour  Intake 1000 ml  Output -  Net 1000 ml    Labs/Imaging Results for orders placed or performed during the hospital encounter of 09/26/19 (from the past 48 hour(s))  Lipase, blood     Status: None   Collection Time: 09/26/19  8:29 PM  Result Value Ref Range   Lipase 18 11 - 51 U/L    Comment: Performed at [facility], [address] [place], [place] [address]  Comprehensive metabolic panel     Status: Abnormal   Collection Time: 09/26/19  8:29 PM  Result Value Ref Range   Sodium 140 135 - 145 mmol/L   Potassium 3.9 3.5 - 5.1 mmol/L   Chloride 101 98 - 111 mmol/L   CO2 29 22 - 32 mmol/L   Glucose, Bld 116 (H) 70 - 99 mg/dL   BUN 8 6 - 20 mg/dL   Creatinine, Ser 5.45 0.61 - 1.24 mg/dL   Calcium 9.7 8.9 - 62.5 mg/dL   Total Protein 8.0 6.5 - 8.1 g/dL   Albumin 5.1 (H) 3.5 - 5.0 g/dL   AST 14 (L)  15 - 41 U/L   ALT 14 0 - 44 U/L   Alkaline Phosphatase 40 38 - 126 U/L   Total Bilirubin 0.7 0.3 - 1.2 mg/dL   GFR calc non Af Amer >60 >60 mL/min   GFR calc Af Amer >60 >60 mL/min   Anion gap 10 5 - 15    Comment: Performed at [facility], [place] [address] [place], [place] [address]  CBC     Status: Abnormal   Collection Time: 09/26/19  8:29 PM  Result Value Ref Range   WBC 13.8 (H) 4.0 - 10.5 K/uL   RBC 5.38 4.22 - 5.81 MIL/uL   Hemoglobin 16.3 13.0 - 17.0 g/dL   HCT 48.3 39.0 - 52.0 %   MCV 89.8 80.0 - 100.0 fL   MCH 30.3 26.0 - 34.0 pg   MCHC 33.7 30.0 - 36.0 g/dL   RDW 12.4 11.5 - 15.5 %   Platelets 192 150 - 400 K/uL   nRBC 0.0 0.0 - 0.2 %    Comment: Performed at [facility], [place] [address], [place], [place] [address]  Urinalysis,  Routine w reflex microscopic     Status: None   Collection Time: 09/26/19  8:29 PM  Result Value Ref Range   Color, Urine YELLOW YELLOW   APPearance CLEAR CLEAR   Specific Gravity, Urine 1.011 1.005 - 1.030   pH 5.0 5.0 - 8.0   Glucose, UA NEGATIVE NEGATIVE mg/dL   Hgb urine dipstick NEGATIVE NEGATIVE   Bilirubin Urine NEGATIVE NEGATIVE   Ketones, ur NEGATIVE NEGATIVE mg/dL   Protein, ur NEGATIVE NEGATIVE mg/dL   Nitrite NEGATIVE NEGATIVE   Leukocytes,Ua NEGATIVE NEGATIVE    Comment: Performed at [place] [address] [place], [place] [address]  Ethanol     Status: None   Collection Time: 09/26/19 10:27 PM  Result Value Ref Range   Alcohol, Ethyl (B) <10 <10 mg/dL    Comment: (NOTE) Lowest detectable limit for serum alcohol is 10 mg/dL. For medical purposes only. Performed at [facility], [place] [address] [place],  [address]   SARS Coronavirus 2 by RT PCR (hospital order, performed in [facility] hospital lab) Nasopharyngeal Nasopharyngeal Swab     Status: None   Collection Time: 09/26/19 10:31 PM   Specimen: Nasopharyngeal Swab  Result Value Ref Range   SARS Coronavirus 2 NEGATIVE NEGATIVE    Comment: (NOTE) If result is NEGATIVE SARS-CoV-2 target nucleic acids are NOT DETECTED. The SARS-CoV-2 RNA is generally detectable in upper and lower  respiratory specimens during the acute phase of infection. The lowest  concentration of SARS-CoV-2 viral copies this assay can detect is 250  copies / mL. A negative result does not preclude SARS-CoV-2 infection  and should not be used as the sole basis for treatment or other  patient management decisions.  A negative result may occur with  improper specimen collection / handling, submission of specimen other  than nasopharyngeal swab, presence of viral mutation(s) within the  areas targeted by this assay, and inadequate number of viral copies  (<250 copies / mL). A negative result  must be combined with clinical  observations, patient history, and epidemiological information. If result is POSITIVE SARS-CoV-2 target nucleic acids are DETECTED. The SARS-CoV-2 RNA is generally detectable in upper and lower  respiratory specimens dur ing the acute phase of infection.  Positive  results are indicative of active infection with SARS-CoV-2.  Clinical  correlation with  patient history and other diagnostic information is  necessary to determine patient infection status.  Positive results do  not rule out bacterial infection or co-infection with other viruses. If result is PRESUMPTIVE POSTIVE SARS-CoV-2 nucleic acids MAY BE PRESENT.   A presumptive positive result was obtained on the submitted specimen  and confirmed on repeat testing.  While 2019 novel coronavirus  (SARS-CoV-2) nucleic acids may be present in the submitted sample  additional confirmatory testing may be necessary for epidemiological  and / or clinical management purposes  to differentiate between  SARS-CoV-2 and other Sarbecovirus currently known to infect humans.  If clinically indicated additional testing with an alternate test  methodology [phone]([id number]) is advised. The SARS-CoV-2 RNA is generally  detectable in upper and lower respiratory sp ecimens during the acute  phase of infection. The expected result is Negative. Fact Sheet for Patients:  BoilerBrush.com.cyhttps://www.fda.gov/media/136312/download Fact Sheet for Healthcare Providers: https://pope.com/https://www.fda.gov/media/136313/download This test is not yet approved or cleared by the [place] FDA and has been authorized for detection and/or diagnosis of SARS-CoV-2 by FDA under an Emergency Use Authorization (EUA).  This EUA will remain in effect (meaning this test can be used) for the duration of the COVID-19 declaration under Section 564(b)(1) of the Act, 21 U.S.C. section 360bbb-3(b)(1), unless the authorization is terminated or revoked sooner. Performed at [facility], [address] [place], [place] [id number]    Dg Abdomen 1 View  Result Date: 09/27/2019 CLINICAL DATA:  Nasogastric tube placement EXAM: ABDOMEN - 1 VIEW COMPARISON:  None. FINDINGS: The side port of the nasogastric tube is in the lower esophagus. Recommend advancing by 10 cm. There are dilated loops of bowel in the upper abdomen. IMPRESSION: 1. Side port of the nasogastric tube in the lower esophagus. Recommend advancing by 10 cm. 2. Dilated loops of bowel in the upper abdomen, compatible with small bowel obstruction. Electronically Signed   By: Deatra RobinsonKevin  Herman M.D.   On: 09/27/2019 02:43   Ct Abdomen Pelvis W Contrast  Result Date: 09/27/2019 CLINICAL DATA:  Abdominal pain, vomiting, history of prior small bowel obstruction EXAM: CT ABDOMEN AND PELVIS WITH CONTRAST TECHNIQUE: Multidetector CT imaging of the abdomen and pelvis was performed using the standard protocol following bolus administration of intravenous contrast. CONTRAST:  100mL OMNIPAQUE IOHEXOL 300 MG/ML  SOLN COMPARISON:  CT abdomen pelvis 11/12/2018 FINDINGS: Lower chest: Bibasilar atelectasis. Normal heart size. No pericardial effusion. Hepatobiliary: Stable subcentimeter hypoattenuating focus in the posterior right lobe liver (2/15). Too small to fully characterize on CT imaging but statistically likely benign. No concerning liver abnormality is seen. No gallstones, gallbladder wall thickening, or biliary dilatation. Pancreas: Mild pancreatic atrophy. No ductal dilatation or inflammation. Spleen: Normal in size without focal abnormality. Adrenals/Urinary Tract: Adrenal glands are unremarkable. Subcentimeter hypoattenuating focus in the interpolar right kidney is unchanged from prior, too small to fully characterize though likely a benign cyst. Kidneys are otherwise unremarkable, without renal calculi, suspicious lesion, or hydronephrosis. Bladder is unremarkable. Stomach/Bowel: Small hiatal hernia. Stomach and  duodenal sweep are unremarkable. There are loops air and fluid distended small bowel in the left upper quadrant. More dilated portions of the small bowel demonstrate fecalized contents/80 small bowels feces sign. There is mild associated mesenteric edema with slight twisting and displacement to the left upper quadrant. A proximal transition point is seen on axial image 2/39 in the upper midline abdomen. Distal transition point is noted at the level of the umbilicus (2/60) where a portion of the small bowel  partially protrudes into a ventral diastasis. No pneumatosis or venous gas. More distal small bowel is decompressed. A normal appendix is visualized. No colonic dilatation or wall thickening. Vascular/Lymphatic: The aorta is normal caliber. No suspicious or enlarged lymph nodes in the included lymphatic chains. Reproductive: The prostate and seminal vesicles are unremarkable. Other: No abdominopelvic free fluid or free gas. Portion of the small bowel is closely apposed to a ventral diastasis at the level of the umbilicus. Musculoskeletal: Transitional lumbosacral anatomy with only 4 lumbar type vertebral bodies. No acute osseous abnormality or suspicious osseous lesion. IMPRESSION: Small-bowel obstruction with proximal transition point in the upper midline abdomen and a distal transition point at the level of the umbilicus where a portion of the small bowel partially protrudes into a ventral diastasis. Minimal mesenteric stranding. No pneumatosis or portal venous gas. Small hiatal hernia. Electronically Signed   By: Kreg Shropshire M.D.   On: 09/27/2019 00:27   Dg Chest Port 1 View  Result Date: 09/26/2019 CLINICAL DATA:  Cough, abdominal pain EXAM: PORTABLE CHEST 1 VIEW COMPARISON:  Radiograph Apr 18, 2016 FINDINGS: Lung volumes are low with streaky basilar areas of opacity. No convincing features of edema, pneumothorax, or effusion. Pulmonary vascularity is normally distributed. The cardiomediastinal contours  are unremarkable. No acute osseous or soft tissue abnormality. IMPRESSION: Low lung volumes with streaky basilar areas of opacity, favored to reflect atelectasis in the absence of infectious clinical picture. Electronically Signed   By: Kreg Shropshire M.D.   On: 09/26/2019 22:47    Pending Labs Unresulted Labs (From admission, onward)    Start     Ordered   10/04/19 0500  Creatinine, serum  (enoxaparin (LOVENOX)    CrCl >/= 30 ml/min)  Weekly,   R    Comments: while on enoxaparin therapy    09/27/19 0340   09/27/19 0338  CBC  (enoxaparin (LOVENOX)    CrCl >/= 30 ml/min)  Once,   STAT    Comments: Baseline for enoxaparin therapy IF NOT ALREADY DRAWN.  Notify MD if PLT < 100 K.    09/27/19 0340   09/27/19 0338  Creatinine, serum  (enoxaparin (LOVENOX)    CrCl >/= 30 ml/min)  Once,   STAT    Comments: Baseline for enoxaparin therapy IF NOT ALREADY DRAWN.    09/27/19 0340   09/26/19 2235  Rapid urine drug screen (hospital performed)  Add-on,   AD     09/26/19 2234          Vitals/Pain Today's Vitals   09/27/19 0019 09/27/19 0157 09/27/19 0242 09/27/19 0441  BP:  (!) 143/92 (!) 138/96 120/76  Pulse:  89 75 73  Resp:  Temp:      TempSrc:      SpO2:  96% 96% 98%  Weight:      Height:      PainSc: 0-No pain       Isolation Precautions No active isolations  Medications Medications  sodium phosphate (FLEET) 7-19 GM/118ML enema 1 enema (has no administration in time range)  benztropine (COGENTIN) tablet 1.5 mg (has no administration in time range)  guaiFENesin (ROBITUSSIN) 100 MG/5ML solution 200 mg (has no administration in time range)  enoxaparin (LOVENOX) injection 40 mg (has no administration in time range)  dextrose 5 %-0.45 % sodium chloride infusion (has no administration in time range)  sodium chloride 0.9 % bolus 1,000 mL (0 mLs Intravenous Stopped 09/27/19 0145)  ondansetron (ZOFRAN) injection 4 mg (4 mg  Intravenous Given 09/26/19 2322)  morphine 4 MG/ML  injection 4 mg (4 mg Intravenous Given 09/26/19 2324)  iohexol (OMNIPAQUE) 300 MG/ML solution 100 mL (100 mLs Intravenous Contrast Given 09/26/19 2351)    Mobility walks with person assist Low fall risk   Focused Assessments    R Recommendations: See Admitting Provider Note  Report given to:   Additional Notes:

## 2019-09-27 NOTE — H&P (Signed)
History and Physical    Gerald Jackson NFA:[id number] DOB: [birth year] DOA: 09/26/2019  PCP: Lewis Moccasin, MD (Confirm with patient/family/NH records and if not entered, this has to be entered at [facility] point of entry) Patient coming from: long term care group home  I have personally briefly reviewed patient's old medical records in [facility]  Chief Complaint: Abdominal pain  HPI: Gerald Jackson is a 43 y.o. male with medical history significant of  with a hx of COPD, Depression, HTN, hyperlipidemia, SBO 2019 2/2 severe constipation managed medically. He presents to the Emergency Department complaining of gradual, persistent, constant and progressively worsening generalized abd pain onset 8am. Pt reports his pain is sharp/stabbing 10/10.  Associated symptoms include nausea and vomiting.  Pt reports 3 episode of NBNB emesis.  Last BM today around 4pm and normal.  He reports at that time he was passing gas, but has not since that time. No treatments PTA.  Pt reports nothing seems to make the pain better or worse.  Pt reports subjective fevers and chills, cough and fatigue with unknown onset.  Pt denies fever, chills, headache, neck pain, chest pain, SOB.  Pt reports hx of appendectomy, denies any other abd surgeries.  Pt reports intermittent EtOH usage, last 1 large beer was "earlier today."  Pt reports smoking marijuana x1 today.  He denies regular usage.  Both of these occurred before he developed pain. Pt reports living in a group home. Caveat to history - patients mental status  ED Course: Hemodynamically stable. Lab work was unremarkable except for a mild leukocytgosis to 13.8. CT abd revealed SBO with a fixed transition point. F/u KUP after NG tube placement confirms dilated small bowel c/w SBO. He is [facility] to [facility] for admission.  .  Review of Systems: As per HPI otherwise 10 point review of systems negative. Of note patient denies pain at the time of admission exam.   Past Medical  History:  Diagnosis Date  . COPD (chronic obstructive pulmonary disease) (HCC)   . Depression   . Gout 11/12/2018  . Hyperlipidemia 11/12/2018  . Hypertension 11/12/2018    Past Surgical History:  Procedure Laterality Date  . APPENDECTOMY     Single. Lives in a group home.   reports that he has been smoking cigarettes. He has been smoking about 0.50 packs per day. He has never used smokeless tobacco. He reports current alcohol use. He reports current drug use. Drug: Marijuana.  No Known Allergies  No family history on file.  Patient cannot give history  Prior to Admission medications   Medication Sig Start Date End Date Taking? Authorizing Provider  acetaminophen (TYLENOL) 325 MG tablet Take 650 mg by mouth every 4 (four) hours as needed for mild pain or headache.   Yes [provider]  allopurinol (ZYLOPRIM) 100 MG tablet Take 100 mg by mouth 2 (two) times daily. 10/18/18  Yes [provider]  alum & mag hydroxide-simeth (GERI-LANTA) 200-200-20 MG/5ML suspension Take 30 mLs by mouth every 6 (six) hours as needed for indigestion or heartburn.   Yes [provider]  amLODipine (NORVASC) 10 MG tablet Take 10 mg by mouth daily. 10/18/18  Yes [provider]  atorvastatin (LIPITOR) 20 MG tablet Take 20 mg by mouth at bedtime.  10/18/18  Yes [provider]  benztropine (COGENTIN) 1 MG tablet Take 1.5 mg by mouth 2 (two) times daily.    Yes [provider]  buPROPion (WELLBUTRIN XL) 150 MG 24  hr tablet Take 150 mg by mouth daily. 10/18/18  Yes [provider]  docusate sodium (COLACE) 100 MG capsule Take 1 capsule (100 mg total) by mouth daily as needed. 11/14/18 11/14/19 Yes Osvaldo Shipper, MD  guaiFENesin (ROBITUSSIN) 100 MG/5ML liquid Take 200 mg by mouth every 6 (six) hours as needed for cough.   Yes [provider]  ibuprofen (ADVIL,MOTRIN) 200 MG tablet Take 400 mg by mouth every 6 (six) hours as needed for mild  pain.   Yes [provider]  lisinopril (PRINIVIL,ZESTRIL) 5 MG tablet Take 5 mg by mouth daily. 10/18/18  Yes [provider]  loperamide (IMODIUM) 2 MG capsule Take 2 mg by mouth See admin instructions. Take 2 mg by mouth after each loose stool. Max of 3 doses in 12 hours. Notify MD if diarrhea persists past 12 hours   Yes [provider]  loxapine (LOXITANE) 10 MG capsule Take 20 mg by mouth at bedtime. 09/23/18  Yes [provider]  omeprazole (PRILOSEC) 20 MG capsule Take 20 mg by mouth daily. 10/18/18  Yes [provider]  Paliperidone Palmitate (INVEGA TRINZA) 819 MG/2.625ML SUSP Inject 1 Applicatorful into the muscle every 3 (three) months.   Yes [provider]  polyethylene glycol (MIRALAX / GLYCOLAX) packet Take 17 g by mouth daily. 11/14/18  Yes Osvaldo Shipper, MD  sertraline (ZOLOFT) 100 MG tablet Take 150 mg by mouth daily.    Yes [provider]  sodium phosphate (FLEET) 7-19 GM/118ML ENEM Place 1 enema rectally See admin instructions. If no relief from milk of mag and prune juice, give 1 enema rectally as needed for constipation. Contact MD if not relieved. Notify MD if constipation is accompanied by severe abdominal pain   Yes [provider]  traZODone (DESYREL) 150 MG tablet Take 150 mg by mouth at bedtime.   Yes [provider]    Physical Exam: Vitals:   09/26/19 2318 09/26/19 2330 09/27/19 0157 09/27/19 0242  BP: 129/87 124/73 (!) 143/92 (!) 138/96  Pulse: 69 73 89 75  Resp: 18 18 16 16   Temp:      TempSrc:      SpO2: 98% 98% 96% 96%  Weight:      Height:        Constitutional: NAD, calm, comfortable Vitals:   09/26/19 2318 09/26/19 2330 09/27/19 0157 09/27/19 0242  BP: 129/87 124/73 (!) 143/92 (!) 138/96  Pulse: 69 73 89 75  Resp: 18 18 16 16   Temp:      TempSrc:      SpO2: 98% 98% 96% 96%  Weight:      Height:       General appearance - WNWD man in no distress Eyes: PERRL, mild  bulbar injection OD,  ENMT: Mucous membranes are dry Posterior pharynx clear of any exudate or lesions. Neck: normal, supple, no masses, no thyromegaly Respiratory: clear to auscultation bilaterally, no wheezing, no crackles. Normal respiratory effort. No accessory muscle use.  Cardiovascular: Regular rate and rhythm, no murmurs / rubs / gallops. No extremity edema. 2+ pedal pulses. No carotid bruits.  Abdomen: absent BS upper quadrants, scattered hypoactive BS lower quadrants. No guarding or rebound,no significant tenderness to deep palpation..  Musculoskeletal: no clubbing / cyanosis. No joint deformity upper and lower extremities. Good ROM, no contractures. Normal muscle tone.  Skin: no rashes, lesions, ulcers. No induration Neurologic: CN 2-12 grossly intact. Strength 5/5 in all 4.  Psychiatric: Could not assess judgement. Alert and oriented x  3. Normal mood.    Labs on Admission: I have personally reviewed following labs and imaging studies  CBC: Recent Labs  Lab 09/26/19 2029  WBC 13.8*  HGB 16.3  HCT 48.3  MCV 89.8  PLT 192   Basic Metabolic Panel: Recent Labs  Lab 09/26/19 2029  NA 140  K 3.9  CL 101  CO2 29  GLUCOSE 116*  BUN 8  CREATININE 0.99  CALCIUM 9.7   GFR: Estimated Creatinine Clearance: 118.8 mL/min (by C-G formula based on SCr of 0.99 mg/dL). Liver Function Tests: Recent Labs  Lab 09/26/19 2029  AST 14*  ALT 14  ALKPHOS 40  BILITOT 0.7  PROT 8.0  ALBUMIN 5.1*   Recent Labs  Lab 09/26/19 2029  LIPASE 18   No results for input(s): AMMONIA in the last 168 hours. Coagulation Profile: No results for input(s): INR, PROTIME in the last 168 hours. Cardiac Enzymes: No results for input(s): CKTOTAL, CKMB, CKMBINDEX, TROPONINI in the last 168 hours. BNP (last 3 results) No results for input(s): PROBNP in the last 8760 hours. HbA1C: No results for input(s): HGBA1C in the last 72 hours. CBG: No results for input(s): GLUCAP in the last 168 hours.  Lipid Profile: No results for input(s): CHOL, HDL, LDLCALC, TRIG, CHOLHDL, LDLDIRECT in the last 72 hours. Thyroid Function Tests: No results for input(s): TSH, T4TOTAL, FREET4, T3FREE, THYROIDAB in the last 72 hours. Anemia Panel: No results for input(s): VITAMINB12, FOLATE, FERRITIN, TIBC, IRON, RETICCTPCT in the last 72 hours. Urine analysis:    Component Value Date/Time   COLORURINE YELLOW 09/26/2019 2029   APPEARANCEUR CLEAR 09/26/2019 2029   LABSPEC 1.011 09/26/2019 2029   PHURINE 5.0 09/26/2019 2029   GLUCOSEU NEGATIVE 09/26/2019 2029   HGBUR NEGATIVE 09/26/2019 2029   BILIRUBINUR NEGATIVE 09/26/2019 2029   KETONESUR NEGATIVE 09/26/2019 2029   PROTEINUR NEGATIVE 09/26/2019 2029   NITRITE NEGATIVE 09/26/2019 2029   LEUKOCYTESUR NEGATIVE 09/26/2019 2029    Radiological Exams on Admission: Dg Abdomen 1 View  Result Date: 09/27/2019 CLINICAL DATA:  Nasogastric tube placement EXAM: ABDOMEN - 1 VIEW COMPARISON:  None. FINDINGS: The side port of the nasogastric tube is in the lower esophagus. Recommend advancing by 10 cm. There are dilated loops of bowel in the upper abdomen. IMPRESSION: 1. Side port of the nasogastric tube in the lower esophagus. Recommend advancing by 10 cm. 2. Dilated loops of bowel in the upper abdomen, compatible with small bowel obstruction. Electronically Signed   By: Deatra Robinson M.D.   On: 09/27/2019 02:43   Ct Abdomen Pelvis W Contrast  Result Date: 09/27/2019 CLINICAL DATA:  Abdominal pain, vomiting, history of prior small bowel obstruction EXAM: CT ABDOMEN AND PELVIS WITH CONTRAST TECHNIQUE: Multidetector CT imaging of the abdomen and pelvis was performed using the standard protocol following bolus administration of intravenous contrast. CONTRAST:  OMNIPAQUE IOHEXOL 300 MG/ML  SOLN COMPARISON:  CT abdomen pelvis 11/12/2018 FINDINGS: Lower chest: Bibasilar atelectasis. Normal heart size. No pericardial effusion. Hepatobiliary: Stable subcentimeter  hypoattenuating focus in the posterior right lobe liver (2/15). Too small to fully characterize on CT imaging but statistically likely benign. No concerning liver abnormality is seen. No gallstones, gallbladder wall thickening, or biliary dilatation. Pancreas: Mild pancreatic atrophy. No ductal dilatation or inflammation. Spleen: Normal in size without focal abnormality. Adrenals/Urinary Tract: Adrenal glands are unremarkable. Subcentimeter hypoattenuating focus in the interpolar right kidney is unchanged from prior, too small to fully characterize though likely a benign cyst. Kidneys are otherwise unremarkable, without  renal calculi, suspicious lesion, or hydronephrosis. Bladder is unremarkable. Stomach/Bowel: Small hiatal hernia. Stomach and duodenal sweep are unremarkable. There are loops air and fluid distended small bowel in the left upper quadrant. More dilated portions of the small bowel demonstrate fecalized contents/80 small bowels feces sign. There is mild associated mesenteric edema with slight twisting and displacement to the left upper quadrant. A proximal transition point is seen on axial image 2/39 in the upper midline abdomen. Distal transition point is noted at the level of the umbilicus (2/60) where a portion of the small bowel partially protrudes into a ventral diastasis. No pneumatosis or venous gas. More distal small bowel is decompressed. A normal appendix is visualized. No colonic dilatation or wall thickening. Vascular/Lymphatic: The aorta is normal caliber. No suspicious or enlarged lymph nodes in the included lymphatic chains. Reproductive: The prostate and seminal vesicles are unremarkable. Other: No abdominopelvic free fluid or free gas. Portion of the small bowel is closely apposed to a ventral diastasis at the level of the umbilicus. Musculoskeletal: Transitional lumbosacral anatomy with only 4 lumbar type vertebral bodies. No acute osseous abnormality or suspicious osseous lesion.  IMPRESSION: Small-bowel obstruction with proximal transition point in the upper midline abdomen and a distal transition point at the level of the umbilicus where a portion of the small bowel partially protrudes into a ventral diastasis. Minimal mesenteric stranding. No pneumatosis or portal venous gas. Small hiatal hernia. Electronically Signed   By: Kreg Shropshire M.D.   On: 09/27/2019 00:27   Dg Chest Port 1 View  Result Date: 09/26/2019 CLINICAL DATA:  Cough, abdominal pain EXAM: PORTABLE CHEST 1 VIEW COMPARISON:  Radiograph Apr 18, 2016 FINDINGS: Lung volumes are low with streaky basilar areas of opacity. No convincing features of edema, pneumothorax, or effusion. Pulmonary vascularity is normally distributed. The cardiomediastinal contours are unremarkable. No acute osseous or soft tissue abnormality. IMPRESSION: Low lung volumes with streaky basilar areas of opacity, favored to reflect atelectasis in the absence of infectious clinical picture. Electronically Signed   By: Kreg Shropshire M.D.   On: 09/26/2019 22:47    EKG: Independently reviewed. Sinus rhythm, question old posterior infarct. NO change from last study in 2017  Assessment/Plan Active Problems:   SBO (small bowel obstruction) (HCC)   COPD (chronic obstructive pulmonary disease) (HCC)   Hypertension   Hyperlipidemia   Gout  (please populate well all problems here in Problem List. (For example, if patient is on BP meds at home and you resume or decide to hold them, it is a problem that needs to be her. Same for CAD, COPD, HLD and so on)   1. SBO - patient with SBO upper abdomen. Non-acute abdomen on exam. No radiographic evidence of severe constipation. Appears comfortable at exam. Plan Inpatient admit  NPO including meds  NG to low suction  F/U KUB in 24 hrs  Surgical consult to be called later in AM  2. HTN- BP ok at admission. Meds are on hold 2/2 SBO Plan For rising BP consider IV meds,, e.g hydralazine or CCB  3. Gout -  no active flare.    DVT prophylaxis: lovenox (Lovenox/Heparin/SCD's/anticoagulated/None (if comfort care) Code Status: full code (Full/Partial (specify details) Family Communication: did not call listed contact due to the hour. (Specify name, relationship. Do not write "discussed with patient". Specify tel # if discussed over the phone) Disposition Plan: return to group home 3-4 days (specify when and where you expect patient to be discharged) Consults called: general surgery will  be called later in the day (with names) Admission status: inpatient (inpatient / obs / tele / medical floor / SDU)   Illene Regulus MD [facility] Pager [phone]  If 7PM-7AM, please contact night-coverage [email or web address] Password [email or web address]  09/27/2019, 3:48 AM

## 2019-09-27 NOTE — Progress Notes (Signed)
NG tube advanced 20cm per order while off suction. Flushed with air to ensure no kinks. Gastrograffin administered and flushed with sterile water. Clamped after administration.

## 2019-09-27 NOTE — Progress Notes (Signed)
Patient seen and examined and agree with plan of care as per my partner Dr. Linda Hedges who admitted this patient earlier this morning  6 black male COPD schizophrenia depression  q 3 mo IM Ivega gout HLD HTN prior admission 10/2018 SBO--has had appendectomy as a child  Doesn't worh-lives at [facility] for enrichment over 5 yrs 2/2 Mental illness  Admit with SBO Cont d5/ns 86 rpt labs am Gen surg Dr. Johney Maine has seen patient Gastrografin protocol in place-ice chips ngt has been advanced Hopeful for non-op resolution  No charge  Verneita Griffes, MD [facility] 11:57 AM

## 2019-09-27 NOTE — Plan of Care (Signed)
Patient lying in bed this morning; complains of throat hurting from NG tube. No other needs expressed. Will continue to monitor.

## 2019-09-27 NOTE — Progress Notes (Signed)
NGT was advanced 10 cm as recommenced by xray.

## 2019-09-27 NOTE — Consult Note (Signed)
Gerald Jackson  September 20, 1976 [id number]  CARE TEAM:  PCP: Fanny Bien, MD  Outpatient Care Team: Patient Care Team: Fanny Bien, MD as PCP - General (Family Medicine) Patient, No Pcp Per (General Practice)  Inpatient Treatment Team: Treatment Team: Attending Provider: Terrilee Croak, MD; Registered Nurse: Heloise Ochoa, RN; Rounding Team: Fatima Blank, MD; Technician: Leda Quail, NT; Registered Nurse: Petra Kuba, RN; Consulting Physician: Edison Pace, Md, MD   This patient is a 43 y.o.male who presents today for surgical evaluation at the request of Dr Pietro Cassis.   Chief complaint / Reason for evaluation: Recurrent small bowel obstruction  Gentleman with a history of many medical problems including COPD, schizoaffective disorder/depression.  Marijuana alcohol use, gout, hypertension.  Had appendectomy but no other abdominal surgery.  Was admitted last Thanksgiving with concerns for possible bowel obstruction versus severe constipation.  Turned around quickly with enema and bowel regimen.    Had worsening abdominal pain for the past 24 hours.  Became unbearable.  Has had 3 episodes of emesis.  Not bloody or coffee-ground.  No sick contacts or travel history.  Sounds like a history of chronic constipation but notes he did have a bowel movement the day before.  He is not on chronic narcotics.  CT scan concerning for bowel obstruction.  Surgical consultation requested.  Nasogastric tube has been placed.  Patient has more complaints about the NG tube than his abdomen.  Denies any flatus or bowel movements at this time   Assessment  Gerald Jackson  43 y.o. male       Problem List:  Principal Problem:   SBO (small bowel obstruction) (HCC) Active Problems:   Schizoaffective disorder, depressive type (HCC)   Constipation, chronic   Hyperlipidemia   COPD (chronic obstructive pulmonary disease) (HCC)   Hypertension   Gout   Allergic rhinitis   Gastroesophageal  reflux disease   Moderate recurrent major depression ([place])   Paranoid schizophrenia ([place])   Peripheral vascular disease ([place])   History of small bowel obstruction   Marijuana use, episodic   Alcohol use   Hiatal hernia   Recurrent small obstruction in a patient with history of chronic constipation.  Most likely due to adhesions related to need for appendectomy.  Plan:  -Nasogastric tube decompression.  I suspect that it is and not far enough and needs to be advanced further.  Rehydration.  Small bowel protocol.  This is second admission within a year with a obvious significant transition point.  Concerned that he may not fly.  However there is no hard evidence that he needs emergency surgery so reasonable to do the small bowel protocol and see if we can turn things around nonoperatively  Enema ordered given significant stool colonic burden  I would not trust any enteral medications to work with his bowel obstruction.  He may need IMR IV alternatives given history of depression and schizoaffective disorder.  Defer to admitting medicine service.  -VTE prophylaxis- SCDs, etc -mobilize as tolerated to help recovery  45 minutes spent in review, evaluation, examination, counseling, and coordination of care.  More than 50% of that time was spent in counseling.  Adin Hector, MD, FACS, MASCRS Gastrointestinal and Minimally Invasive Surgery    [address] [place], [place] [address] [phone] Main / Paging [phone] Fax   09/27/2019      Past Medical History:  Diagnosis Date   COPD (chronic obstructive pulmonary disease) ([place])  Depression    Gout 11/12/2018   Hyperlipidemia 11/12/2018   Hypertension 11/12/2018    Past Surgical History:  Procedure Laterality Date   APPENDECTOMY      Social History   Socioeconomic History   Marital status: Single    Spouse name: Not on file   Number of children: Not on file   Years of  education: Not on file   Highest education level: Not on file  Occupational History   Not on file  Social Needs   Financial resource strain: Not on file   Food insecurity    Worry: Not on file    Inability: Not on file   Transportation needs    Medical: Not on file    Non-medical: Not on file  Tobacco Use   Smoking status: Current Some Day Smoker    Packs/day: 0.50    Types: Cigarettes   Smokeless tobacco: Never Used  Substance and Sexual Activity   Alcohol use: Yes    Comment: occasionally   Drug use: Yes    Types: Marijuana   Sexual activity: Not on file  Lifestyle   Physical activity    Days per week: Not on file    Minutes per session: Not on file   Stress: Not on file  Relationships   Social connections    Talks on phone: Not on file    Gets together: Not on file    Attends religious service: Not on file    Active member of club or organization: Not on file    Attends meetings of clubs or organizations: Not on file    Relationship status: Not on file   Intimate partner violence    Fear of current or ex partner: Not on file    Emotionally abused: Not on file    Physically abused: Not on file    Forced sexual activity: Not on file  Other Topics Concern   Not on file  Social History Narrative   ** Merged History Encounter **        No family history on file.  Current Facility-Administered Medications  Medication Dose Route Frequency Provider Last Rate Last Dose   benztropine (COGENTIN) tablet 1.5 mg  1.5 mg Oral BID Norins, Rosalyn Gess, MD       dextrose 5 %-0.45 % sodium chloride infusion   Intravenous Continuous Norins, Rosalyn Gess, MD 50 mL/hr at 09/27/19 0552     enoxaparin (LOVENOX) injection 40 mg  40 mg Subcutaneous Q24H Norins, Rosalyn Gess, MD       guaiFENesin (ROBITUSSIN) 100 MG/5ML solution 200 mg  200 mg Oral Q6H PRN Norins, Rosalyn Gess, MD       phenol (CHLORASEPTIC) mouth spray 1 spray  1 spray Mouth/Throat PRN Norins, Rosalyn Gess, MD        sodium phosphate (FLEET) 7-19 GM/118ML enema 1 enema  1 enema Rectal PRN Norins, Rosalyn Gess, MD         No Known Allergies  ROS:   All other systems reviewed & are negative except per HPI or as noted below: Constitutional:  No fevers, chills, sweats.  Weight stable Eyes:  No vision changes, No discharge HENT:  No sore throats, nasal drainage Lymph: No neck swelling, No bruising easily Pulmonary:  No cough, productive sputum CV: No orthopnea, PND  Patient walks 20 minutes for about 1/2 miles without difficulty.  No exertional chest/neck/shoulder/arm pain. GI: No personal nor family history of GI/colon cancer, inflammatory bowel disease, irritable bowel syndrome,  allergy such as Celiac Sprue, dietary/dairy problems, colitis, ulcers nor gastritis.  No recent sick contacts/gastroenteritis.  No travel outside the country.  No changes in diet. Renal: No UTIs, No hematuria Genital:  No drainage, bleeding, masses Musculoskeletal: No severe joint pain.  Good ROM major joints Skin:  No sores or lesions.  No rashes Heme/Lymph:  No easy bleeding.  No swollen lymph nodes Neuro: No focal weakness/numbness.  No seizures Psych: No suicidal ideation.  No hallucinations  BP (!) 143/97 (BP Location: Right Arm)    Pulse 73    Temp 97.8 F (36.6 C) (Oral)    Resp 17    Ht 6\' 1"  (1.854 m)    Wt 98.4 kg    SpO2 97%    BMI 28.63 kg/m   Physical Exam: General: Pt resting.  Awakens in no major distress.  Oriented x3   not toxic nor sickly  Eyes: PERRL, normal EOM. Sclera nonicteric Neuro: CN II-XII intact w/o focal sensory/motor deficits. Lymph: No head/neck/groin lymphadenopathy Psych:  No delerium/psychosis/paranoia.  Somewhat withdrawn with slightly atypical affect. HENT: Normocephalic, Mucus membranes moist.  No thrush, G-tube in place.  Somewhat thin in both borders out.  Clear slightly pink-tinged and fluent.  Using Chloraseptic Neck: Supple, No tracheal deviation Chest: No pain.  Good respiratory  excursion. CV:  Pulses intact.  Regular rhythm Abdomen: Soft, moderately distended.  No peritonitis or guarding.  No incarcerated hernias. Gen:  No inguinal hernias.  No inguinal lymphadenopathy.   Ext:  SCDs BLE.  No significant edema.  No cyanosis Skin: No petechiae / purpurea.  No major sores Musculoskeletal: No severe joint pain.  Good ROM major joints   Results:   Labs: Results for orders placed or performed during the hospital encounter of 09/26/19 (from the past 48 hour(s))  Lipase, blood     Status: None   Collection Time: 09/26/19  8:29 PM  Result Value Ref Range   Lipase 18 11 - 51 U/L    Comment: Performed at [facility], [address] [place], [place] [address]  Comprehensive metabolic panel     Status: Abnormal   Collection Time: 09/26/19  8:29 PM  Result Value Ref Range   Sodium 140 135 - 145 mmol/L   Potassium 3.9 3.5 - 5.1 mmol/L   Chloride 101 98 - 111 mmol/L   CO2 29 22 - 32 mmol/L   Glucose, Bld 116 (H) 70 - 99 mg/dL   BUN 8 6 - 20 mg/dL   Creatinine, Ser 9.140.99 0.61 - 1.24 mg/dL   Calcium 9.7 8.9 - 78.210.3 mg/dL   Total Protein 8.0 6.5 - 8.1 g/dL   Albumin 5.1 (H) 3.5 - 5.0 g/dL   AST 14 (L) 15 - 41 U/L   ALT 14 0 - 44 U/L   Alkaline Phosphatase 40 38 - 126 U/L   Total Bilirubin 0.7 0.3 - 1.2 mg/dL   GFR calc non Af Amer >60 >60 mL/min   GFR calc Af Amer >60 >60 mL/min   Anion gap 10 5 - 15    Comment: Performed at [facility], [address] [place], [place] [address]  CBC     Status: Abnormal   Collection Time: 09/26/19  8:29 PM  Result Value Ref Range   WBC 13.8 (H) 4.0 - 10.5 K/uL   RBC 5.38 4.22 - 5.81 MIL/uL   Hemoglobin 16.3 13.0 - 17.0 g/dL   HCT 30.848.3 65.739.0 - 84.652.0 %   MCV  89.8 80.0 - 100.0 fL   MCH 30.3 26.0 - 34.0 pg   MCHC 33.7 30.0 - 36.0 g/dL   RDW 16.1 09.6 - 04.5 %   Platelets 192 150 - 400 K/uL   nRBC 0.0 0.0 - 0.2 %    Comment: Performed at [facility], [address], [place], [place] [address]  Urinalysis, Routine w reflex microscopic     Status: None   Collection Time: 09/26/19  8:29 PM  Result Value Ref Range   Color, Urine YELLOW YELLOW   APPearance CLEAR CLEAR   Specific Gravity, Urine 1.011 1.005 - 1.030   pH 5.0 5.0 - 8.0   Glucose, UA NEGATIVE NEGATIVE mg/dL   Hgb urine dipstick NEGATIVE NEGATIVE   Bilirubin Urine NEGATIVE NEGATIVE   Ketones, ur NEGATIVE NEGATIVE mg/dL   Protein, ur NEGATIVE NEGATIVE mg/dL   Nitrite NEGATIVE NEGATIVE   Leukocytes,Ua NEGATIVE NEGATIVE    Comment: Performed at [facility], [address] [place], [place] [address]  Ethanol     Status: None   Collection Time: 09/26/19 10:27 PM  Result Value Ref Range   Alcohol, Ethyl (B) <10 <10 mg/dL    Comment: (NOTE) Lowest detectable limit for serum alcohol is 10 mg/dL. For medical purposes only. Performed at [facility], [address], [place], [place] [address]   SARS Coronavirus 2 by RT PCR (hospital order, performed in [facility] hospital lab) Nasopharyngeal Nasopharyngeal Swab     Status: None   Collection Time: 09/26/19 10:31 PM   Specimen: Nasopharyngeal Swab  Result Value Ref Range   SARS Coronavirus 2 NEGATIVE NEGATIVE    Comment: (NOTE) If result is NEGATIVE SARS-CoV-2 target nucleic acids are NOT DETECTED. The SARS-CoV-2 RNA is generally detectable in upper and lower  respiratory specimens during the acute phase of infection. The lowest  concentration of SARS-CoV-2 viral copies this assay can detect is 250  copies / mL. A negative result does not preclude SARS-CoV-2 infection  and should not be used as the sole basis for treatment or other  patient management decisions.  A negative result may occur with  improper specimen collection / handling, submission of specimen other  than nasopharyngeal swab, presence of viral mutation(s) within the  areas targeted by this assay, and inadequate number of viral copies    (<250 copies / mL). A negative result must be combined with clinical  observations, patient history, and epidemiological information. If result is POSITIVE SARS-CoV-2 target nucleic acids are DETECTED. The SARS-CoV-2 RNA is generally detectable in upper and lower  respiratory specimens dur ing the acute phase of infection.  Positive  results are indicative of active infection with SARS-CoV-2.  Clinical  correlation with patient history and other diagnostic information is  necessary to determine patient infection status.  Positive results do  not rule out bacterial infection or co-infection with other viruses. If result is PRESUMPTIVE POSTIVE SARS-CoV-2 nucleic acids MAY BE PRESENT.   A presumptive positive result was obtained on the submitted specimen  and confirmed on repeat testing.  While 2019 novel coronavirus  (SARS-CoV-2) nucleic acids may be present in the submitted sample  additional confirmatory testing may be necessary for epidemiological  and / or clinical management purposes  to differentiate between  SARS-CoV-2 and other Sarbecovirus currently known to infect humans.  If clinically indicated additional testing with an alternate test  methodology [phone]) is advised. The SARS-CoV-2 RNA is generally  detectable in upper and lower  respiratory sp ecimens during the acute  phase of infection. The expected result is Negative. Fact Sheet for Patients:  BoilerBrush.com.cy Fact Sheet for Healthcare Providers: https://pope.com/ This test is not yet approved or cleared by the [place] FDA and has been authorized for detection and/or diagnosis of SARS-CoV-2 by FDA under an Emergency Use Authorization (EUA).  This EUA will remain in effect (meaning this test can be used) for the duration of the COVID-19 declaration under Section 564(b)(1) of the Act, 21 U.S.C. section 360bbb-3(b)(1), unless the authorization is terminated  or revoked sooner. Performed at [facility], [address] [place], [place] [address]     Imaging / Studies: Dg Abdomen 1 View  Result Date: 09/27/2019 CLINICAL DATA:  Nasogastric tube placement EXAM: ABDOMEN - 1 VIEW COMPARISON:  None. FINDINGS: The side port of the nasogastric tube is in the lower esophagus. Recommend advancing by 10 cm. There are dilated loops of bowel in the upper abdomen. IMPRESSION: 1. Side port of the nasogastric tube in the lower esophagus. Recommend advancing by 10 cm. 2. Dilated loops of bowel in the upper abdomen, compatible with small bowel obstruction. Electronically Signed   By: Deatra Robinson M.D.   On: 09/27/2019 02:43   Ct Abdomen Pelvis W Contrast  Result Date: 09/27/2019 CLINICAL DATA:  Abdominal pain, vomiting, history of prior small bowel obstruction EXAM: CT ABDOMEN AND PELVIS WITH CONTRAST TECHNIQUE: Multidetector CT imaging of the abdomen and pelvis was performed using the standard protocol following bolus administration of intravenous contrast. CONTRAST:  OMNIPAQUE IOHEXOL 300 MG/ML  SOLN COMPARISON:  CT abdomen pelvis 11/12/2018 FINDINGS: Lower chest: Bibasilar atelectasis. Normal heart size. No pericardial effusion. Hepatobiliary: Stable subcentimeter hypoattenuating focus in the posterior right lobe liver (2/15). Too small to fully characterize on CT imaging but statistically likely benign. No concerning liver abnormality is seen. No gallstones, gallbladder wall thickening, or biliary dilatation. Pancreas: Mild pancreatic atrophy. No ductal dilatation or inflammation. Spleen: Normal in size without focal abnormality. Adrenals/Urinary Tract: Adrenal glands are unremarkable. Subcentimeter hypoattenuating focus in the interpolar right kidney is unchanged from prior, too small to fully characterize though likely a benign cyst. Kidneys are otherwise unremarkable, without renal calculi, suspicious lesion, or hydronephrosis. Bladder is  unremarkable. Stomach/Bowel: Small hiatal hernia. Stomach and duodenal sweep are unremarkable. There are loops air and fluid distended small bowel in the left upper quadrant. More dilated portions of the small bowel demonstrate fecalized contents/80 small bowels feces sign. There is mild associated mesenteric edema with slight twisting and displacement to the left upper quadrant. A proximal transition point is seen on axial image 2/39 in the upper midline abdomen. Distal transition point is noted at the level of the umbilicus (2/60) where a portion of the small bowel partially protrudes into a ventral diastasis. No pneumatosis or venous gas. More distal small bowel is decompressed. A normal appendix is visualized. No colonic dilatation or wall thickening. Vascular/Lymphatic: The aorta is normal caliber. No suspicious or enlarged lymph nodes in the included lymphatic chains. Reproductive: The prostate and seminal vesicles are unremarkable. Other: No abdominopelvic free fluid or free gas. Portion of the small bowel is closely apposed to a ventral diastasis at the level of the umbilicus. Musculoskeletal: Transitional lumbosacral anatomy with only 4 lumbar type vertebral bodies. No acute osseous abnormality or suspicious osseous lesion. IMPRESSION: Small-bowel obstruction with proximal transition point in the upper midline abdomen and a distal transition point at the level of the umbilicus where a portion of the  small bowel partially protrudes into a ventral diastasis. Minimal mesenteric stranding. No pneumatosis or portal venous gas. Small hiatal hernia. Electronically Signed   By: Kreg Shropshire M.D.   On: 09/27/2019 00:27   Dg Chest Port 1 View  Result Date: 09/26/2019 CLINICAL DATA:  Cough, abdominal pain EXAM: PORTABLE CHEST 1 VIEW COMPARISON:  Radiograph Apr 18, 2016 FINDINGS: Lung volumes are low with streaky basilar areas of opacity. No convincing features of edema, pneumothorax, or effusion. Pulmonary  vascularity is normally distributed. The cardiomediastinal contours are unremarkable. No acute osseous or soft tissue abnormality. IMPRESSION: Low lung volumes with streaky basilar areas of opacity, favored to reflect atelectasis in the absence of infectious clinical picture. Electronically Signed   By: Kreg Shropshire M.D.   On: 09/26/2019 22:47    Medications / Allergies: per chart  Antibiotics: Anti-infectives (From admission, onward)   None        Note: Portions of this report may have been transcribed using voice recognition software. Every effort was made to ensure accuracy; however, inadvertent computerized transcription errors may be present.   Any transcriptional errors that result from this process are unintentional.    Ardeth Sportsman, MD, FACS, MASCRS Gastrointestinal and Minimally Invasive Surgery    [address] [place], [place] [address] [phone] Main / Paging [phone] Fax   09/27/2019

## 2019-09-28 ENCOUNTER — Inpatient Hospital Stay (HOSPITAL_COMMUNITY): Payer: Medicare Other

## 2019-09-28 LAB — BASIC METABOLIC PANEL
Anion gap: 8 (ref 5–15)
BUN: 8 mg/dL (ref 6–20)
CO2: 27 mmol/L (ref 22–32)
Calcium: 8.5 mg/dL — ABNORMAL LOW (ref 8.9–10.3)
Chloride: 104 mmol/L (ref 98–111)
Creatinine, Ser: 0.93 mg/dL (ref 0.61–1.24)
GFR calc Af Amer: >60 mL/min (ref >60)
GFR calc non Af Amer: >60 mL/min (ref >60)
Glucose, Bld: 96 mg/dL (ref 70–99)
Potassium: 3.2 mmol/L — ABNORMAL LOW (ref 3.5–5.1)
Sodium: 139 mmol/L (ref 135–145)

## 2019-09-28 LAB — CBC WITH DIFFERENTIAL/PLATELET
Abs Immature Granulocytes: 0.02 10*3/uL (ref 0.00–0.07)
Basophils Absolute: 0 10*3/uL (ref 0.0–0.1)
Basophils Relative: 0 %
Eosinophils Absolute: 0.1 10*3/uL (ref 0.0–0.5)
Eosinophils Relative: 1 %
HCT: 42.3 % (ref 39.0–52.0)
Hemoglobin: 13.9 g/dL (ref 13.0–17.0)
Immature Granulocytes: 0 %
Lymphocytes Relative: 38 %
Lymphs Abs: 3.7 10*3/uL (ref 0.7–4.0)
MCH: 29.5 pg (ref 26.0–34.0)
MCHC: 32.9 g/dL (ref 30.0–36.0)
MCV: 89.8 fL (ref 80.0–100.0)
Monocytes Absolute: 0.9 10*3/uL (ref 0.1–1.0)
Monocytes Relative: 9 %
Neutro Abs: 5.2 10*3/uL (ref 1.7–7.7)
Neutrophils Relative %: 52 %
Platelets: 155 10*3/uL (ref 150–400)
RBC: 4.71 MIL/uL (ref 4.22–5.81)
RDW: 12.2 % (ref 11.5–15.5)
WBC: 9.9 10*3/uL (ref 4.0–10.5)
nRBC: 0 % (ref 0.0–0.2)

## 2019-09-28 LAB — MAGNESIUM: Magnesium: 2.2 mg/dL (ref 1.7–2.4)

## 2019-09-28 MED ORDER — METOPROLOL TARTRATE 5 MG/5ML IV SOLN: 5.0000 mg | Freq: Four times a day (QID) | INTRAVENOUS | Status: DC | PRN

## 2019-09-28 MED ORDER — POTASSIUM CL IN DEXTROSE 5% 20 MEQ/L IV SOLN
20.0000 meq | INTRAVENOUS | Status: DC
  Administered 2019-09-28 – 2019-09-29 (×2): 20 meq via INTRAVENOUS
  Filled 2019-09-28 (×2): qty 1000

## 2019-09-28 NOTE — Progress Notes (Signed)
SBO (small bowel obstruction) (HCC)  Subjective: Pt still with some bloating, passing some flatus.  No BM's yet.  AXR shows contrast in colon.  Objective: Vital signs in last 24 hours: Temp:  [98.1 F (36.7 C)-98.7 F (37.1 C)] 98.7 F (37.1 C) (10/11 0604) Pulse Rate:  [63-91] 63 (10/11 0604) Resp:  [17-18] 17 (10/11 0604) BP: (131-138)/(80-96) 134/80 (10/11 0604) SpO2:  [95 %-98 %] 98 % (10/11 0604) Last BM Date: 09/27/19(watery)  Intake/Output from previous day: 10/10 0701 - 10/11 0700 In: 1234 [P.O.:30; I.V.:1204] Out: 1575 [Urine:400; Emesis/NG output:1175] Intake/Output this shift: No intake/output data recorded.  General appearance: alert and cooperative GI: soft, non-tender  Lab Results:  Results for orders placed or performed during the hospital encounter of 09/26/19 (from the past 24 hour(s))  Basic metabolic panel     Status: Abnormal   Collection Time: 09/28/19  3:33 AM  Result Value Ref Range   Sodium 139 135 - 145 mmol/L   Potassium 3.2 (L) 3.5 - 5.1 mmol/L   Chloride 104 98 - 111 mmol/L   CO2 27 22 - 32 mmol/L   Glucose, Bld 96 70 - 99 mg/dL   BUN 8 6 - 20 mg/dL   Creatinine, Ser 0.93 0.61 - 1.24 mg/dL   Calcium 8.5 (L) 8.9 - 10.3 mg/dL   GFR calc non Af Amer >60 >60 mL/min   GFR calc Af Amer >60 >60 mL/min   Anion gap 8 5 - 15  CBC with Differential/Platelet     Status: None   Collection Time: 09/28/19  3:33 AM  Result Value Ref Range   WBC 9.9 4.0 - 10.5 K/uL   RBC 4.71 4.22 - 5.81 MIL/uL   Hemoglobin 13.9 13.0 - 17.0 g/dL   HCT 42.3 39.0 - 52.0 %   MCV 89.8 80.0 - 100.0 fL   MCH 29.5 26.0 - 34.0 pg   MCHC 32.9 30.0 - 36.0 g/dL   RDW 12.2 11.5 - 15.5 %   Platelets 155 150 - 400 K/uL   nRBC 0.0 0.0 - 0.2 %   Neutrophils Relative % 52 %   Neutro Abs 5.2 1.7 - 7.7 K/uL   Lymphocytes Relative 38 %   Lymphs Abs 3.7 0.7 - 4.0 K/uL   Monocytes Relative 9 %   Monocytes Absolute 0.9 0.1 - 1.0 K/uL   Eosinophils Relative 1 %   Eosinophils Absolute  0.1 0.0 - 0.5 K/uL   Basophils Relative 0 %   Basophils Absolute 0.0 0.0 - 0.1 K/uL   Immature Granulocytes 0 %   Abs Immature Granulocytes 0.02 0.00 - 0.07 K/uL     Studies/Results Radiology     MEDS, Scheduled . enoxaparin (LOVENOX) injection  40 mg Subcutaneous Q24H  . lip balm  1 application Topical BID     Assessment: SBO (small bowel obstruction) ([place]) Resolved on AXR  Plan: D/c NG Clears Advance diet as tolerated  LOS: 1 day    Rosario Adie, MD [facility] Surgery, [place]   09/28/2019 8:47 AM

## 2019-09-28 NOTE — Progress Notes (Signed)
Hospitalist progress note  Gerald Jackson  VZD:[id number] DOB: [birth year] DOA: 09/26/2019 PCP: Fanny Bien, MD  Narrative:  47 black male COPD schizophrenia depression  q 3 mo IM Ivega gout HLD HTN prior admission 10/2018 SBO--has had appendectomy as a child  Doesn't worh-lives at [facility] for enrichment over 5 yrs 2/2 Mental illness Admitted with small bowel obstruction General surgery saw placed on Gastrografin protocol - Seems to have resolved  Assessment & Plan: Resolving SBO-x-ray 10/11 a.m. = contrast into colon graduate-NG tube removed-IV fluid as below-continue Fleet enema Reglan given in addition to Dulcolax suppository-clear liquid diet per surgery Schizophrenia depression-continue every 3 monthly in Vega injections-PTA Loxitane 20 at bedtime, sertraline 150 daily Cogentin 1.5 twice daily trazodone 150 at bedtime Wellbutrin 150 XL--we will resume if he tolerates clears fairly well HLD-continue Lipitor on discharge HTN holding Norvasc 10 at this time, lisinopril 5 daily-on IV metoprolol blood pressure above 160 Gout-holding allopurinol 100 twice daily at this time Mild hypokalemia change fluids to dextrose with 20 of K run at 75 an hour repeat labs a.m. leukocytosis on admission-resolved and likely was secondary to stress of having SBO   DVT prophylaxis: Lovenox   Code Status:   Full   Family Communication:   None Disposition Plan: Hopefully discharge soon  Consultants:   GI Procedures:   None Antimicrobials:   None Subjective: Awake alert coherent no distress at this time passing some flatus no stool yet not nauseous   Objective: Vitals:   09/27/19 0537 09/27/19 1342 09/27/19 2031 09/28/19 0604  BP: (!) 143/97 (!) 138/96 (!) 131/91 134/80  Pulse: 73 91 80 63  Resp: 17 18 18 17   Temp: 97.8 F (36.6 C) 98.2 F (36.8 C) 98.1 F (36.7 C) 98.7 F (37.1 C)  TempSrc: Oral Oral Oral Oral  SpO2: 97% 95% 96% 98%  Weight:      Height:        Intake/Output  Summary (Last 24 hours) at 09/28/2019 1147 Last data filed at 09/28/2019 1000 Gross per 24 hour  Intake 1229 ml  Output 1375 ml  Net -146 ml   Filed Weights   09/26/19 2023  Weight: 98.4 kg    Examination: EOMI NCAT no distress no icterus no pallor Chest clear no added sound Abdomen soft less distended than prior no rebound no guarding No lower extremity edema No focal deficit Neurologically intact and moving all 4 limbs equally grossly normal sensory Psych is euthymic appropriate  Data Reviewed: I have personally reviewed following labs and imaging studies CBC: Recent Labs  Lab 09/26/19 2029 09/28/19 0333  WBC 13.8* 9.9  NEUTROABS  --  5.2  HGB 16.3 13.9  HCT 48.3 42.3  MCV 89.8 89.8  PLT 192 295   Basic Metabolic Panel: Recent Labs  Lab 09/26/19 2029 09/28/19 0333  NA 140 139  K 3.9 3.2*  CL 101 104  CO2 29 27  GLUCOSE 116* 96  BUN 8 8  CREATININE 0.99 0.93  CALCIUM 9.7 8.5*   GFR: Estimated Creatinine Clearance: 126.5 mL/min (by C-G formula based on SCr of 0.93 mg/dL). Liver Function Tests: Recent Labs  Lab 09/26/19 2029  AST 14*  ALT 14  ALKPHOS 40  BILITOT 0.7  PROT 8.0  ALBUMIN 5.1*   Recent Labs  Lab 09/26/19 2029  LIPASE 18   No results for input(s): AMMONIA in the last 168 hours. Coagulation Profile: No results for input(s): INR, PROTIME in the last 168 hours. Cardiac Enzymes: Radiology Studies:  Reviewed images personally in health database  Scheduled Meds: . enoxaparin (LOVENOX) injection  40 mg Subcutaneous Q24H  . lip balm  1 application Topical BID   Continuous Infusions: . dextrose 5 % with KCl 20 mEq / L    . lactated ringers    . ondansetron (ZOFRAN) IV      LOS: 1 day   Time spent: [address] Pleas Koch, MD [facility]  If 7PM-7AM, please contact night-coverage-look on AMION to find my number otherwise-prefer pages-not epic chat,please 09/28/2019, 11:47 AM

## 2019-09-28 NOTE — Progress Notes (Signed)
NG tube removed per MD order

## 2019-09-28 NOTE — Plan of Care (Signed)
Patient sitting up in bed this morning; pain controlled, no nausea. Throat better but still sore from NG tube. No needs expressed at this time. Will continue to monitor.

## 2019-09-29 ENCOUNTER — Inpatient Hospital Stay (HOSPITAL_COMMUNITY): Payer: Medicare Other

## 2019-09-29 LAB — BASIC METABOLIC PANEL
Anion gap: 8 (ref 5–15)
BUN: 6 mg/dL (ref 6–20)
CO2: 25 mmol/L (ref 22–32)
Calcium: 8.7 mg/dL — ABNORMAL LOW (ref 8.9–10.3)
Chloride: 106 mmol/L (ref 98–111)
Creatinine, Ser: 0.88 mg/dL (ref 0.61–1.24)
GFR calc Af Amer: >60 mL/min (ref >60)
GFR calc non Af Amer: >60 mL/min (ref >60)
Glucose, Bld: 106 mg/dL — ABNORMAL HIGH (ref 70–99)
Potassium: 3.5 mmol/L (ref 3.5–5.1)
Sodium: 139 mmol/L (ref 135–145)

## 2019-09-29 MED ORDER — HYDROCORTISONE 1 % EX CREA: 1.0000 "application " | TOPICAL_CREAM | Freq: Three times a day (TID) | CUTANEOUS | 0 refills | Status: AC | PRN

## 2019-09-29 NOTE — Discharge Instructions (Signed)
Soft-Food Eating Plan Please follow this eating plan for the next 2 weeks A soft-food eating plan includes foods that are safe and easy to chew and swallow. Your health care provider or dietitian can help you find foods and flavors that fit into this plan. Follow this plan until your health care provider or dietitian says it is safe to start eating other foods and food textures. What are tips for following this plan? General guidelines   Take small bites of food, or cut food into pieces about  inch or smaller. Bite-sized pieces of food are easier to chew and swallow.  Eat moist foods. Avoid overly dry foods.  Avoid foods that: ? Are difficult to swallow, such as dry, chunky, crispy, or sticky foods. ? Are difficult to chew, such as hard, tough, or stringy foods. ? Contain nuts, seeds, or fruits.  Follow instructions from your dietitian about the types of liquids that are safe for you to swallow. You may be allowed to have: ? Thick liquids only. This includes only liquids that are thicker than honey. ? Thin and thick liquids. This includes all beverages and foods that become liquid at room temperature.  To make thick liquids: ? Purchase a commercial liquid thickening powder. These are available at grocery stores and pharmacies. ? Mix the thickener into liquids according to instructions on the label. ? Purchase ready-made thickened liquids. ? Thicken soup by pureeing, straining to remove chunks, and adding flour, potato flakes, or corn starch. ? Add commercial thickener to foods that become liquid at room temperature, such as milk shakes, yogurt, ice cream, gelatin, and sherbet.  Ask your health care provider whether you need to take a fiber supplement. Cooking  Cook meats so they stay tender and moist. Use methods like braising, stewing, or baking in liquid.  Cook vegetables and fruit until they are soft enough to be mashed with a fork.  Peel soft, fresh fruits such as peaches,  nectarines, and melons.  When making soup, make sure chunks of meat and vegetables are smaller than  inch.  Reheat leftover foods slowly so that a tough crust does not form. What foods are allowed? The items listed below may not be a complete list. Talk with your dietitian about what dietary choices are best for you. Grains Breads, muffins, pancakes, or waffles moistened with syrup, jelly, or butter. Dry cereals well-moistened with milk. Moist, cooked cereals. Well-cooked pasta and rice. Vegetables All soft-cooked vegetables. Shredded lettuce. Fruits All canned and cooked fruits. Soft, peeled fresh fruits. Strawberries. Dairy Milk. Cream. Yogurt. Cottage cheese. Soft cheese without the rind. Meats and other protein foods Tender, moist ground meat, poultry, or fish. Meat cooked in gravy or sauces. Eggs. Sweets and desserts Ice cream. Milk shakes. Sherbet. Pudding. Fats and oils Butter. Margarine. Olive, canola, sunflower, and grapeseed oil. Smooth salad dressing. Smooth cream cheese. Mayonnaise. Gravy. What foods are not allowed? The items listed bemay not be a complete list. Talk with your dietitian about what dietary choices are best for you. Grains Coarse or dry cereals, such as bran, granola, and shredded wheat. Tough or chewy crusty breads, such as [place] bread or baguettes. Breads with nuts, seeds, or fruit. Vegetables All raw vegetables. Cooked corn. Cooked vegetables that are tough or stringy. Tough, crisp, fried potatoes and potato skins. Fruits Fresh fruits with skins or seeds, or both, such as apples, pears, and grapes. Stringy, high-pulp fruits, such as papaya, pineapple, coconut, and mango. Fruit leather and all dried fruit. Dairy Yogurt  with nuts or coconut. Meats and other protein foods Hard, dry sausages. Dry meat, poultry, or fish. Meats with gristle. Fish with bones. Fried meat or fish. Lunch meat and hotdogs. Nuts and seeds. Chunky peanut butter or other nut  butters. Sweets and desserts Cakes or cookies that are very dry or chewy. Desserts with dried fruit, nuts, or coconut. Fried pastries. Very rich pastries. Fats and oils Cream cheese with fruit or nuts. Salad dressings with seeds or chunks. Summary  A soft-food eating plan includes foods that are safe and easy to swallow. Generally, the foods should be soft enough to be mashed with a fork.  Avoid foods that are dry, hard to chew, crunchy, sticky, stringy, or crispy.  Ask your health care provider whether you need to thicken your liquids and if you need to take a fiber supplement. This information is not intended to replace advice given to you by your health care provider. Make sure you discuss any questions you have with your health care provider. Document Released: 03/12/2008 Document Revised: 03/27/2019 Document Reviewed: 02/06/2017 Elsevier Patient Education  2020 [employer] Inc.   High-Fiber Diet Please follow this eating plan starting in 2 weeks after completion of your soft diet Fiber, also called dietary fiber, is a type of carbohydrate that is found in fruits, vegetables, whole grains, and beans. A high-fiber diet can have many health benefits. Your health care provider may recommend a high-fiber diet to help:  Prevent constipation. Fiber can make your bowel movements more regular.  Lower your cholesterol.  Relieve the following conditions: ? Swelling of veins in the anus (hemorrhoids). ? Swelling and irritation (inflammation) of specific areas of the digestive tract (uncomplicated diverticulosis). ? A problem of the large intestine (colon) that sometimes causes pain and diarrhea (irritable bowel syndrome, IBS).  Prevent overeating as part of a weight-loss plan.  Prevent heart disease, type 2 diabetes, and certain cancers. What is my plan? The recommended daily fiber intake in grams (g) includes:  38 g for men age 35 or younger.  30 g for men over age 79.  25 g for women  age 63 or younger.  21 g for women over age 66. You can get the recommended daily intake of dietary fiber by:  Eating a variety of fruits, vegetables, grains, and beans.  Taking a fiber supplement, if it is not possible to get enough fiber through your diet. What do I need to know about a high-fiber diet?  It is better to get fiber through food sources rather than from fiber supplements. There is not a lot of research about how effective supplements are.  Always check the fiber content on the nutrition facts label of any prepackaged food. Look for foods that contain 5 g of fiber or more per serving.  Talk with a diet and nutrition specialist (dietitian) if you have questions about specific foods that are recommended or not recommended for your medical condition, especially if those foods are not listed below.  Gradually increase how much fiber you consume. If you increase your intake of dietary fiber too quickly, you may have bloating, cramping, or gas.  Drink plenty of water. Water helps you to digest fiber. What are tips for following this plan?  Eat a wide variety of high-fiber foods.  Make sure that half of the grains that you eat each day are whole grains.  Eat breads and cereals that are made with whole-grain flour instead of refined flour or white flour.  Eat brown  rice, bulgur wheat, or millet instead of white rice.  Start the day with a breakfast that is high in fiber, such as a cereal that contains 5 g of fiber or more per serving.  Use beans in place of meat in soups, salads, and pasta dishes.  Eat high-fiber snacks, such as berries, raw vegetables, nuts, and popcorn.  Choose whole fruits and vegetables instead of processed forms like juice or sauce. What foods can I eat?  Fruits Berries. Pears. Apples. Oranges. Avocado. Prunes and raisins. Dried figs. Vegetables Sweet potatoes. Spinach. Kale. Artichokes. Cabbage. Broccoli. Cauliflower. Green peas. Carrots.  Squash. Grains Whole-grain breads. Multigrain cereal. Oats and oatmeal. Brown rice. Barley. Bulgur wheat. [place]. Quinoa. Bran muffins. Popcorn. Rye wafer crackers. Meats and other proteins Navy, kidney, and pinto beans. Soybeans. Split peas. Lentils. Nuts and seeds. Dairy Fiber-fortified yogurt. Beverages Fiber-fortified soy milk. Fiber-fortified orange juice. Other foods Fiber bars. The items listed above may not be a complete list of recommended foods and beverages. Contact a dietitian for more options. What foods are not recommended? Fruits Fruit juice. Cooked, strained fruit. Vegetables Fried potatoes. Canned vegetables. Well-cooked vegetables. Grains White bread. Pasta made with refined flour. White rice. Meats and other proteins Fatty cuts of meat. Fried chicken or fried fish. Dairy Milk. Yogurt. Cream cheese. Sour cream. Fats and oils Butters. Beverages Soft drinks. Other foods Cakes and pastries. The items listed above may not be a complete list of foods and beverages to avoid. Contact a dietitian for more information. Summary  Fiber is a type of carbohydrate. It is found in fruits, vegetables, whole grains, and beans.  There are many health benefits of eating a high-fiber diet, such as preventing constipation, lowering blood cholesterol, helping with weight loss, and reducing your risk of heart disease, diabetes, and certain cancers.  Gradually increase your intake of fiber. Increasing too fast can result in cramping, bloating, and gas. Drink plenty of water while you increase your fiber.  The best sources of fiber include whole fruits and vegetables, whole grains, nuts, seeds, and beans. This information is not intended to replace advice given to you by your health care provider. Make sure you discuss any questions you have with your health care provider. Document Released: 12/04/2005 Document Revised: 10/08/2017 Document Reviewed: 10/08/2017 Elsevier Patient  Education  2020 [employer].

## 2019-09-29 NOTE — Progress Notes (Signed)
Discharge instructions given to patient. Patient had no questions. Writer wheeled patient out to his family

## 2019-09-29 NOTE — Progress Notes (Signed)
Subjective: CC: Patient was sleeping when I entered the room.  Easily awoken with verbal questioning.  Patient reports that his abdominal pain and distention have resolved.  He is tolerating full liquids without any nausea or vomiting.  He is passing flatus and had a formed, soft BM yesterday.  He is ambulating in the halls.   Objective: Vital signs in last 24 hours: Temp:  [99 F (37.2 C)-100 F (37.8 C)] 99 F (37.2 C) (10/12 0519) Pulse Rate:  [57-87] 57 (10/12 0519) Resp:  [16-18] 16 (10/12 0519) BP: (91-132)/(65-93) 91/65 (10/12 0519) SpO2:  [98 %-100 %] 100 % (10/12 0519) Last BM Date: 09/28/19  Intake/Output from previous day: 10/11 0701 - 10/12 0700 In: 3144.6 [P.O.:1440; I.V.:1704.6] Out: 2050 [Urine:2050] Intake/Output this shift: No intake/output data recorded.  PE: Gen:  Alert, NAD, pleasant Pulm:  Normal rate and effort  Abd: Soft, NT/ND, +BS, there is a well healed midline surgical scar infraumbilical  Ext:  No LE edema Skin: no rashes noted, warm and dry  Lab Results:  Recent Labs    09/26/19 2029 09/28/19 0333  WBC 13.8* 9.9  HGB 16.3 13.9  HCT 48.3 42.3  PLT 192 155   BMET Recent Labs    09/28/19 0333 09/29/19 0343  NA 139 139  K 3.2* 3.5  CL 104 106  CO2 27 25  GLUCOSE 96 106*  BUN 8 6  CREATININE 0.93 0.88  CALCIUM 8.5* 8.7*   PT/INR No results for input(s): LABPROT, INR in the last 72 hours. CMP     Component Value Date/Time   NA 139 09/29/2019 0343   K 3.5 09/29/2019 0343   CL 106 09/29/2019 0343   CO2 25 09/29/2019 0343   GLUCOSE 106 (H) 09/29/2019 0343   BUN 6 09/29/2019 0343   CREATININE 0.88 09/29/2019 0343   CALCIUM 8.7 (L) 09/29/2019 0343   PROT 8.0 09/26/2019 2029   ALBUMIN 5.1 (H) 09/26/2019 2029   AST 14 (L) 09/26/2019 2029   ALT 14 09/26/2019 2029   ALKPHOS 40 09/26/2019 2029   BILITOT 0.7 09/26/2019 2029   GFRNONAA >60 09/29/2019 0343   GFRAA >60 09/29/2019 0343   Lipase     Component Value  Date/Time   LIPASE 18 09/26/2019 2029       Studies/Results: Abd 1 View (kub)  Result Date: 09/28/2019 CLINICAL DATA:  Per order- SBO (small bowel obstruction) EXAM: ABDOMEN - 1 VIEW COMPARISON:  09/27/2019 FINDINGS: Oral contrast within the ascending and transverse colon. Moderate volume stool in the ascending colon. Gas in the rectum. A single dilated loop of small bowel in the LEFT abdomen. IMPRESSION: No evidence of bowel obstruction. Oral contrast traverses to the descending colon. Gas in the rectum Electronically Signed   By: Genevive Bi M.D.   On: 09/28/2019 04:44   Dg Abd Portable 1v-small Bowel Obstruction Protocol-initial, 8 Hr Delay  Result Date: 09/27/2019 CLINICAL DATA:  Small-bowel obstruction. EXAM: PORTABLE ABDOMEN - 1 VIEW COMPARISON:  09/27/2019 FINDINGS: Oral contrast is noted within the small bowel and colon. The bowel gas pattern is nonspecific and nonobstructive with some mild gaseous distention of loops of small bowel and colon. The urinary bladder is distended with contrast. IMPRESSION: 1. Oral contrast is noted within the small bowel and colon. The bowel gas pattern is nonspecific and nonobstructive. 2. Distended urinary bladder. Electronically Signed   By: Katherine Mantle M.D.   On: 09/27/2019 19:50    Anti-infectives: Anti-infectives (From admission,  onward)   None       Assessment/Plan SBO - Appears to have resolved - Will advance to soft diet, if tolerates, okay for d/c from a surgery standpoint - Continue to encourage mobilization for bowel function - We discussed being on a soft diet the next few weeks after discharge before transitioning to a high fiber diet  FEN - Soft VTE - SCDs, Lovenox, Mobilize, etc ID - None    LOS: 2 days    Jillyn Ledger , [facility] Surgery 09/29/2019, 8:17 AM Pager: [phone]

## 2019-09-29 NOTE — Discharge Summary (Signed)
Physician Discharge Summary  Gerald Jackson ZOX:[id number] DOB: April 15, 1976 DOA: 09/26/2019  PCP: Lewis Moccasin, MD  Admit date: 09/26/2019 Discharge date: 09/29/2019  Time spent: 28 minutes  Recommendations for Outpatient Follow-up:  1. Needs soft diet on discharge for several weeks 2. Continue meds without change  Discharge Diagnoses:  Principal Problem:   SBO (small bowel obstruction) (HCC) Active Problems:   Hyperlipidemia   COPD (chronic obstructive pulmonary disease) (HCC)   Hypertension   Gout   Allergic rhinitis   Gastroesophageal reflux disease   Moderate recurrent major depression (HCC)   Paranoid schizophrenia (HCC)   Schizoaffective disorder, depressive type (HCC)   Peripheral vascular disease (HCC)   Constipation, chronic   History of small bowel obstruction   Marijuana use, episodic   Alcohol use   Hiatal hernia   Discharge Condition: Improved  Diet recommendation: Soft  Filed Weights   09/26/19 2023  Weight: 98.4 kg    History of present illness:  89 black male COPD schizophrenia depressionq 3 moIM Ivega gout HLD HTN prior admission 10/2018 SBO--has had appendectomy as a child  Doesn't worh-[facility] 2/2 Mental illness Admitted with small bowel obstruction General surgery saw placed on Gastrografin protocol  Hospital Course:  Resolving SBO-x-ray 10/11 a.m. = contrast into colon graduate-NG tube removed 10/12 given enemas and passing gas reasonably passing some stool cleared for 2 weeks of soft diet and discharge home per surgery Schizophrenia depression-continue every 3 monthly in Vega injections-PTA Loxitane 20 at bedtime, sertraline 150 daily Cogentin 1.5 twice daily trazodone 150 at bedtime Wellbutrin 150 XL HLD-continue Lipitor on discharge HTN h was on IV metoprolol when he was n.p.o. resume home meds on discharge Gout-holding allopurinol 100 twice daily at this time Mild hypokalemia resolved on discharge  with IV repletion and saline leukocytosis on admission-resolved secondary to stress from infection-stabilized on discharge  I spoke with his mom and she understood Discharge planning  Procedures: General surgery saw the patient Gastrografin protocol done  Discharge Exam: Vitals:   09/29/19 0519 09/29/19 1342  BP: 91/65 128/83  Pulse: (!) 57 65  Resp: 16 17  Temp: 99 F (37.2 C)   SpO2: 100% 100%    General: EOMI NCAT awake alert no distress no deficit Cardiovascular: S1-S2 no murmur rub or gallop Respiratory: Clinically clear no added sounds no rales no rhonchi Abdomen soft tender no rebound  Discharge Instructions   Discharge Instructions    Diet - low sodium heart healthy   Complete by: As directed    Discharge instructions   Complete by: As directed    Eat a soft diet for the next several weeks and then go back to a full diet at close to Halloween Stop Imodium Get labs in 1 week Make sure that you take all meds written for you   Increase activity slowly   Complete by: As directed      Allergies as of 09/29/2019   No Known Allergies     Medication List    STOP taking these medications   loperamide 2 MG capsule Commonly known as: IMODIUM     TAKE these medications   acetaminophen 325 MG tablet Commonly known as: TYLENOL Take 650 mg by mouth every 4 (four) hours as needed for mild pain or headache.   allopurinol 100 MG tablet Commonly known as: ZYLOPRIM Take 100 mg by mouth 2 (two) times daily.   amLODipine 10 MG tablet Commonly known as: NORVASC Take 10  mg by mouth daily.   atorvastatin 20 MG tablet Commonly known as: LIPITOR Take 20 mg by mouth at bedtime.   benztropine 1 MG tablet Commonly known as: COGENTIN Take 1.5 mg by mouth 2 (two) times daily.   buPROPion 150 MG 24 hr tablet Commonly known as: WELLBUTRIN XL Take 150 mg by mouth daily.   docusate sodium 100 MG capsule Commonly known as: Colace Take 1 capsule (100 mg total) by mouth  daily as needed.   Venida JarvisGeri-Lanta 200-200-20 MG/5ML suspension Generic drug: alum & mag hydroxide-simeth Take 30 mLs by mouth every 6 (six) hours as needed for indigestion or heartburn.   guaiFENesin 100 MG/5ML liquid Commonly known as: ROBITUSSIN Take 200 mg by mouth every 6 (six) hours as needed for cough.   hydrocortisone cream 1 % Apply 1 application topically 3 (three) times daily as needed for itching (minor skin irritation).   ibuprofen 200 MG tablet Commonly known as: ADVIL Take 400 mg by mouth every 6 (six) hours as needed for mild pain.   Invega Trinza 819 MG/2.625ML Susp Generic drug: Paliperidone Palmitate Inject 1 Applicatorful into the muscle every 3 (three) months.   lisinopril 5 MG tablet Commonly known as: ZESTRIL Take 5 mg by mouth daily.   loxapine 10 MG capsule Commonly known as: LOXITANE Take 20 mg by mouth at bedtime.   omeprazole 20 MG capsule Commonly known as: PRILOSEC Take 20 mg by mouth daily.   polyethylene glycol 17 g packet Commonly known as: MIRALAX / GLYCOLAX Take 17 g by mouth daily.   sertraline 100 MG tablet Commonly known as: ZOLOFT Take 150 mg by mouth daily.   sodium phosphate 7-19 GM/118ML Enem Place 1 enema rectally See admin instructions. If no relief from milk of mag and prune juice, give 1 enema rectally as needed for constipation. Contact MD if not relieved. Notify MD if constipation is accompanied by severe abdominal pain   traZODone 150 MG tablet Commonly known as: DESYREL Take 150 mg by mouth at bedtime.      No Known Allergies    The results of significant diagnostics from this hospitalization (including imaging, microbiology, ancillary and laboratory) are listed below for reference.    Significant Diagnostic Studies: Dg Chest 2 View  Result Date: 09/29/2019 CLINICAL DATA:  Shortness of breath and cough EXAM: CHEST - 2 VIEW COMPARISON:  Chest radiograph dated 09/26/2019 FINDINGS: The heart size and mediastinal  contours are within normal limits. Both lungs are clear. The visualized skeletal structures are unremarkable. IMPRESSION: No active cardiopulmonary disease. Electronically Signed   By: Romona Curlsyler  Litton M.D.   On: 09/29/2019 12:50   Abd 1 View (kub)  Result Date: 09/28/2019 CLINICAL DATA:  Per order- SBO (small bowel obstruction) EXAM: ABDOMEN - 1 VIEW COMPARISON:  09/27/2019 FINDINGS: Oral contrast within the ascending and transverse colon. Moderate volume stool in the ascending colon. Gas in the rectum. A single dilated loop of small bowel in the LEFT abdomen. IMPRESSION: No evidence of bowel obstruction. Oral contrast traverses to the descending colon. Gas in the rectum Electronically Signed   By: Genevive BiStewart  Edmunds M.D.   On: 09/28/2019 04:44   Dg Abdomen 1 View  Result Date: 09/27/2019 CLINICAL DATA:  Nasogastric tube placement EXAM: ABDOMEN - 1 VIEW COMPARISON:  None. FINDINGS: The side port of the nasogastric tube is in the lower esophagus. Recommend advancing by 10 cm. There are dilated loops of bowel in the upper abdomen. IMPRESSION: 1. Side port of the nasogastric tube in  the lower esophagus. Recommend advancing by 10 cm. 2. Dilated loops of bowel in the upper abdomen, compatible with small bowel obstruction. Electronically Signed   By: Deatra Robinson M.D.   On: 09/27/2019 02:43   Ct Abdomen Pelvis W Contrast  Result Date: 09/27/2019 CLINICAL DATA:  Abdominal pain, vomiting, history of prior small bowel obstruction EXAM: CT ABDOMEN AND PELVIS WITH CONTRAST TECHNIQUE: Multidetector CT imaging of the abdomen and pelvis was performed using the standard protocol following bolus administration of intravenous contrast. CONTRAST:  OMNIPAQUE IOHEXOL 300 MG/ML  SOLN COMPARISON:  CT abdomen pelvis 11/12/2018 FINDINGS: Lower chest: Bibasilar atelectasis. Normal heart size. No pericardial effusion. Hepatobiliary: Stable subcentimeter hypoattenuating focus in the posterior right lobe liver (2/15). Too  small to fully characterize on CT imaging but statistically likely benign. No concerning liver abnormality is seen. No gallstones, gallbladder wall thickening, or biliary dilatation. Pancreas: Mild pancreatic atrophy. No ductal dilatation or inflammation. Spleen: Normal in size without focal abnormality. Adrenals/Urinary Tract: Adrenal glands are unremarkable. Subcentimeter hypoattenuating focus in the interpolar right kidney is unchanged from prior, too small to fully characterize though likely a benign cyst. Kidneys are otherwise unremarkable, without renal calculi, suspicious lesion, or hydronephrosis. Bladder is unremarkable. Stomach/Bowel: Small hiatal hernia. Stomach and duodenal sweep are unremarkable. There are loops air and fluid distended small bowel in the left upper quadrant. More dilated portions of the small bowel demonstrate fecalized contents/80 small bowels feces sign. There is mild associated mesenteric edema with slight twisting and displacement to the left upper quadrant. A proximal transition point is seen on axial image 2/39 in the upper midline abdomen. Distal transition point is noted at the level of the umbilicus (2/60) where a portion of the small bowel partially protrudes into a ventral diastasis. No pneumatosis or venous gas. More distal small bowel is decompressed. A normal appendix is visualized. No colonic dilatation or wall thickening. Vascular/Lymphatic: The aorta is normal caliber. No suspicious or enlarged lymph nodes in the included lymphatic chains. Reproductive: The prostate and seminal vesicles are unremarkable. Other: No abdominopelvic free fluid or free gas. Portion of the small bowel is closely apposed to a ventral diastasis at the level of the umbilicus. Musculoskeletal: Transitional lumbosacral anatomy with only 4 lumbar type vertebral bodies. No acute osseous abnormality or suspicious osseous lesion. IMPRESSION: Small-bowel obstruction with proximal transition point in  the upper midline abdomen and a distal transition point at the level of the umbilicus where a portion of the small bowel partially protrudes into a ventral diastasis. Minimal mesenteric stranding. No pneumatosis or portal venous gas. Small hiatal hernia. Electronically Signed   By: Kreg Shropshire M.D.   On: 09/27/2019 00:27   Dg Chest Port 1 View  Result Date: 09/26/2019 CLINICAL DATA:  Cough, abdominal pain EXAM: PORTABLE CHEST 1 VIEW COMPARISON:  Radiograph Apr 18, 2016 FINDINGS: Lung volumes are low with streaky basilar areas of opacity. No convincing features of edema, pneumothorax, or effusion. Pulmonary vascularity is normally distributed. The cardiomediastinal contours are unremarkable. No acute osseous or soft tissue abnormality. IMPRESSION: Low lung volumes with streaky basilar areas of opacity, favored to reflect atelectasis in the absence of infectious clinical picture. Electronically Signed   By: Kreg Shropshire M.D.   On: 09/26/2019 22:47   Dg Abd Portable 1v-small Bowel Obstruction Protocol-initial, 8 Hr Delay  Result Date: 09/27/2019 CLINICAL DATA:  Small-bowel obstruction. EXAM: PORTABLE ABDOMEN - 1 VIEW COMPARISON:  09/27/2019 FINDINGS: Oral contrast is noted within the small bowel and colon. The  bowel gas pattern is nonspecific and nonobstructive with some mild gaseous distention of loops of small bowel and colon. The urinary bladder is distended with contrast. IMPRESSION: 1. Oral contrast is noted within the small bowel and colon. The bowel gas pattern is nonspecific and nonobstructive. 2. Distended urinary bladder. Electronically Signed   By: Katherine Mantle M.D.   On: 09/27/2019 19:50    Microbiology: Recent Results (from the past 240 hour(s))  SARS Coronavirus 2 by RT PCR (hospital order, performed in [facility] hospital lab) Nasopharyngeal Nasopharyngeal Swab     Status: None   Collection Time: 09/26/19 10:31 PM   Specimen: Nasopharyngeal Swab  Result Value Ref Range Status    SARS Coronavirus 2 NEGATIVE NEGATIVE Final    Comment: (NOTE) If result is NEGATIVE SARS-CoV-2 target nucleic acids are NOT DETECTED. The SARS-CoV-2 RNA is generally detectable in upper and lower  respiratory specimens during the acute phase of infection. The lowest  concentration of SARS-CoV-2 viral copies this assay can detect is 250  copies / mL. A negative result does not preclude SARS-CoV-2 infection  and should not be used as the sole basis for treatment or other  patient management decisions.  A negative result may occur with  improper specimen collection / handling, submission of specimen other  than nasopharyngeal swab, presence of viral mutation(s) within the  areas targeted by this assay, and inadequate number of viral copies  (<250 copies / mL). A negative result must be combined with clinical  observations, patient history, and epidemiological information. If result is POSITIVE SARS-CoV-2 target nucleic acids are DETECTED. The SARS-CoV-2 RNA is generally detectable in upper and lower  respiratory specimens dur ing the acute phase of infection.  Positive  results are indicative of active infection with SARS-CoV-2.  Clinical  correlation with patient history and other diagnostic information is  necessary to determine patient infection status.  Positive results do  not rule out bacterial infection or co-infection with other viruses. If result is PRESUMPTIVE POSTIVE SARS-CoV-2 nucleic acids MAY BE PRESENT.   A presumptive positive result was obtained on the submitted specimen  and confirmed on repeat testing.  While 2019 novel coronavirus  (SARS-CoV-2) nucleic acids may be present in the submitted sample  additional confirmatory testing may be necessary for epidemiological  and / or clinical management purposes  to differentiate between  SARS-CoV-2 and other Sarbecovirus currently known to infect humans.  If clinically indicated additional testing with an alternate test   methodology [phone]) is advised. The SARS-CoV-2 RNA is generally  detectable in upper and lower respiratory sp ecimens during the acute  phase of infection. The expected result is Negative. Fact Sheet for Patients:  BoilerBrush.com.cy Fact Sheet for Healthcare Providers: https://pope.com/ This test is not yet approved or cleared by the [place] FDA and has been authorized for detection and/or diagnosis of SARS-CoV-2 by FDA under an Emergency Use Authorization (EUA).  This EUA will remain in effect (meaning this test can be used) for the duration of the COVID-19 declaration under Section 564(b)(1) of the Act, 21 U.S.C. section 360bbb-3(b)(1), unless the authorization is terminated or revoked sooner. Performed at [facility], [address], [place], [place] [address]      Labs: Basic Metabolic Panel: Recent Labs  Lab 09/26/19 2029 09/28/19 0333 09/29/19 0343  NA 140 139 139  K 3.9 3.2* 3.5  CL 101 104 106  CO2 GLUCOSE 116* 96 106*  BUN 8 8 6  CREATININE 0.99 0.93 0.88  CALCIUM 9.7 8.5* 8.7*  MG  --  2.2  --    Liver Function Tests: Recent Labs  Lab 09/26/19 2029  AST 14*  ALT 14  ALKPHOS 40  BILITOT 0.7  PROT 8.0  ALBUMIN 5.1*   Recent Labs  Lab 09/26/19 2029  LIPASE 18   No results for input(s): AMMONIA in the last 168 hours. CBC: Recent Labs  Lab 09/26/19 2029 09/28/19 0333  WBC 13.8* 9.9  NEUTROABS  --  5.2  HGB 16.3 13.9  HCT 48.3 42.3  MCV 89.8 89.8  PLT 192 155   Cardiac Enzymes: No results for input(s): CKTOTAL, CKMB, CKMBINDEX, TROPONINI in the last 168 hours. BNP: BNP (last 3 results) No results for input(s): BNP in the last 8760 hours.  ProBNP (last 3 results) No results for input(s): PROBNP in the last 8760 hours.  CBG: No results for input(s): GLUCAP in the last 168 hours.     Signed:  Nita Sells MD   [facility] 09/29/2019, 2:12 PM

## 2019-11-23 IMAGING — DX DG CHEST 1V PORT
1 series · 1 of 1 positions shown · non-contrast
Comparison: Radiograph April 18, 2016

CLINICAL DATA: Cough, abdominal pain

EXAM:
PORTABLE CHEST 1 VIEW

[chest ap]
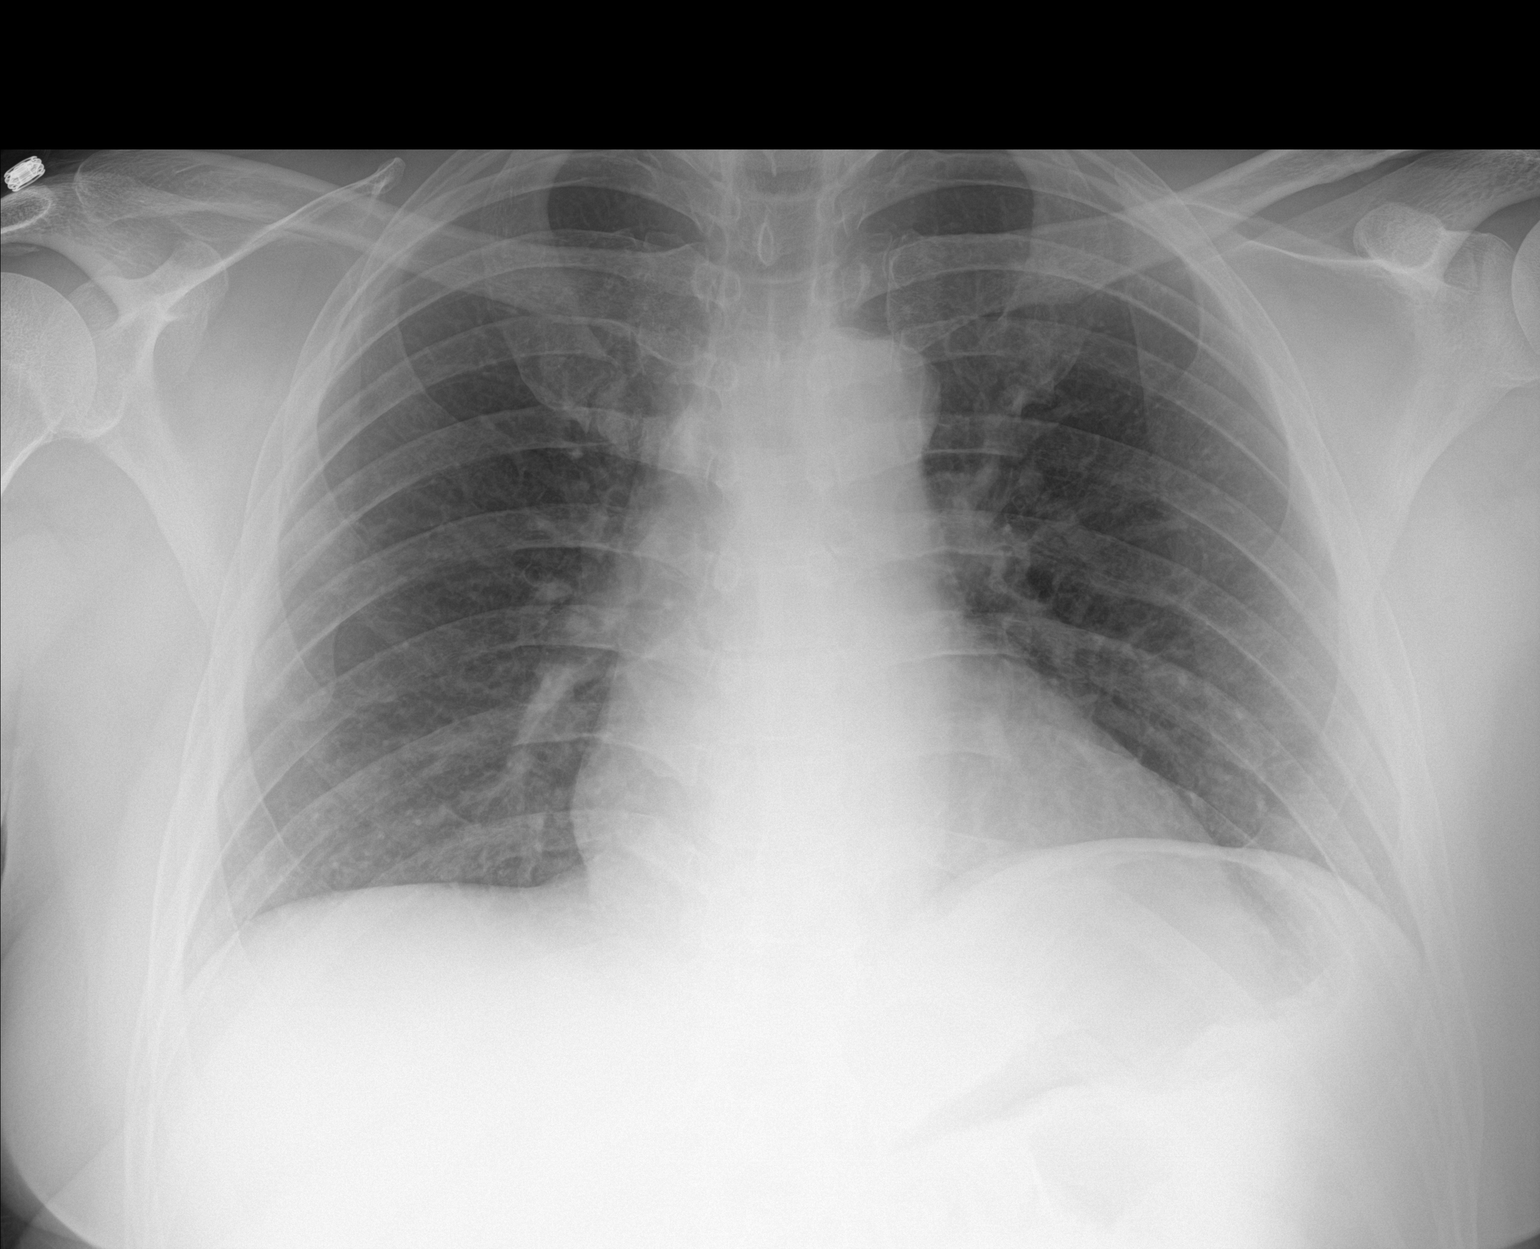

[1 of 1 positions shown; findings below may reference images not displayed]

FINDINGS: Lung volumes are low with streaky basilar areas of opacity. No
convincing features of edema, pneumothorax, or effusion. Pulmonary
vascularity is normally distributed. The cardiomediastinal contours
are unremarkable. No acute osseous or soft tissue abnormality.
IMPRESSION: Low lung volumes with streaky basilar areas of opacity, favored to
reflect atelectasis in the absence of infectious clinical picture.

## 2019-11-24 IMAGING — DX DG ABD PORTABLE 1V
1 series · 1 of 1 positions shown · non-contrast
Comparison: 09/27/2019

CLINICAL DATA: Small-bowel obstruction.

EXAM:
PORTABLE ABDOMEN - 1 VIEW

[abdomen kub]
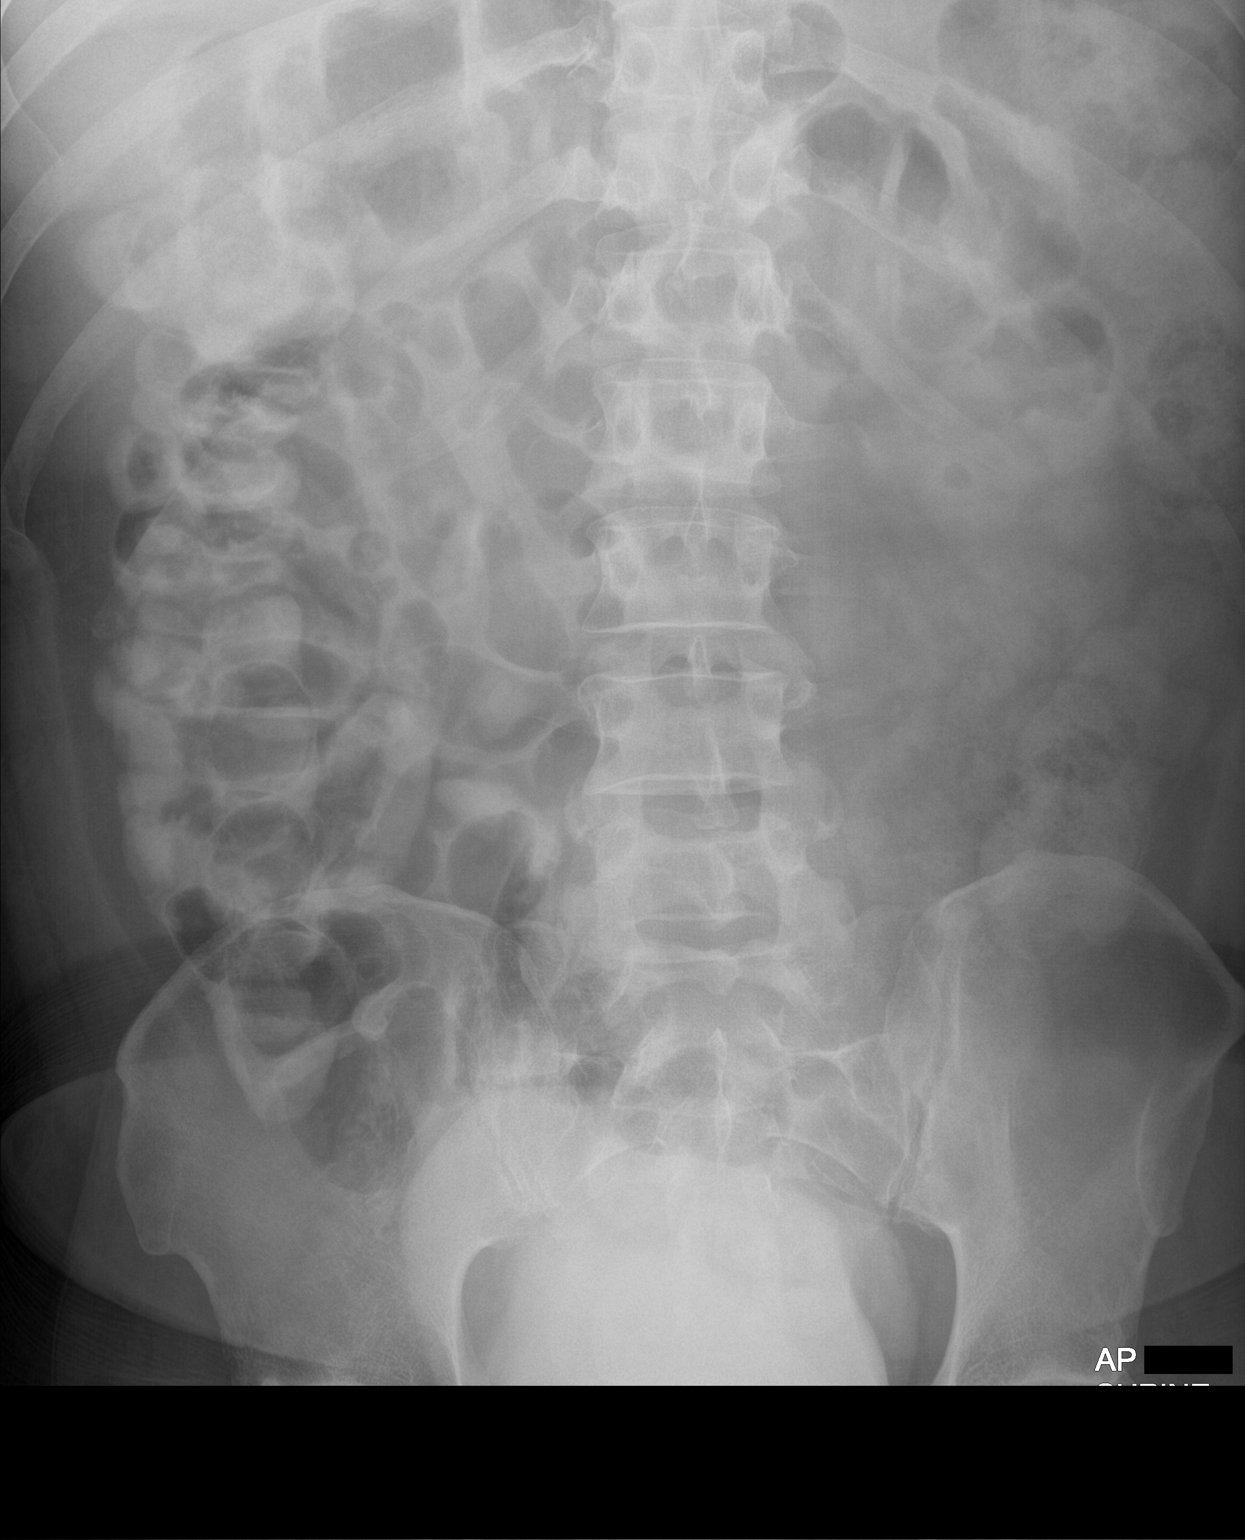

[1 of 1 positions shown; findings below may reference images not displayed]

FINDINGS: Oral contrast is noted within the small bowel and colon. The bowel
gas pattern is nonspecific and nonobstructive with some mild gaseous
distention of loops of small bowel and colon. The urinary bladder is
distended with contrast.
IMPRESSION: 1. Oral contrast is noted within the small bowel and colon. The
bowel gas pattern is nonspecific and nonobstructive.
2. Distended urinary bladder.

## 2019-11-26 IMAGING — DX DG CHEST 2V
2 series · 2 of 2 positions shown · non-contrast
Comparison: Chest radiograph dated 09/26/2019

CLINICAL DATA: Shortness of breath and cough

EXAM:
CHEST - 2 VIEW

[chest pa]
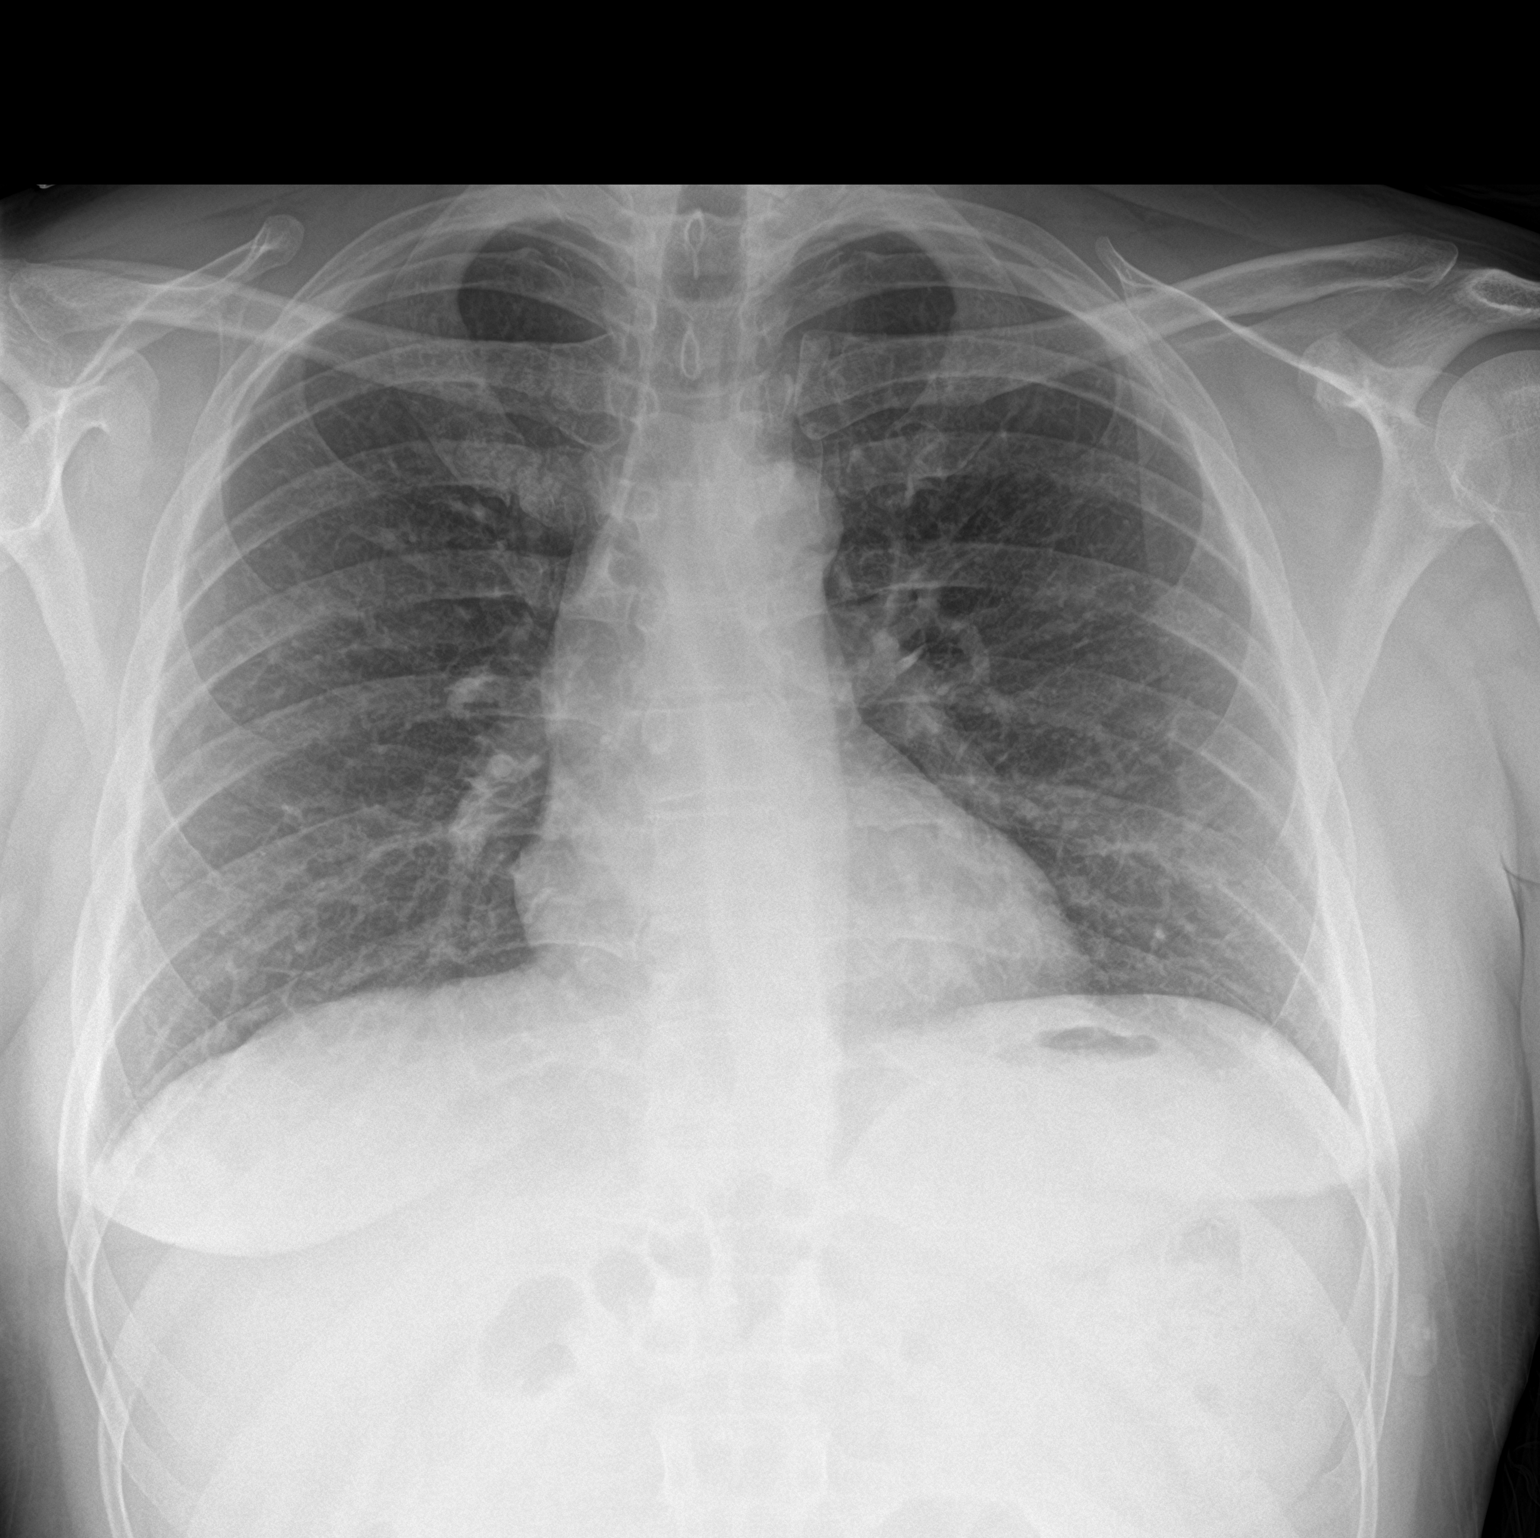

[chest lat]
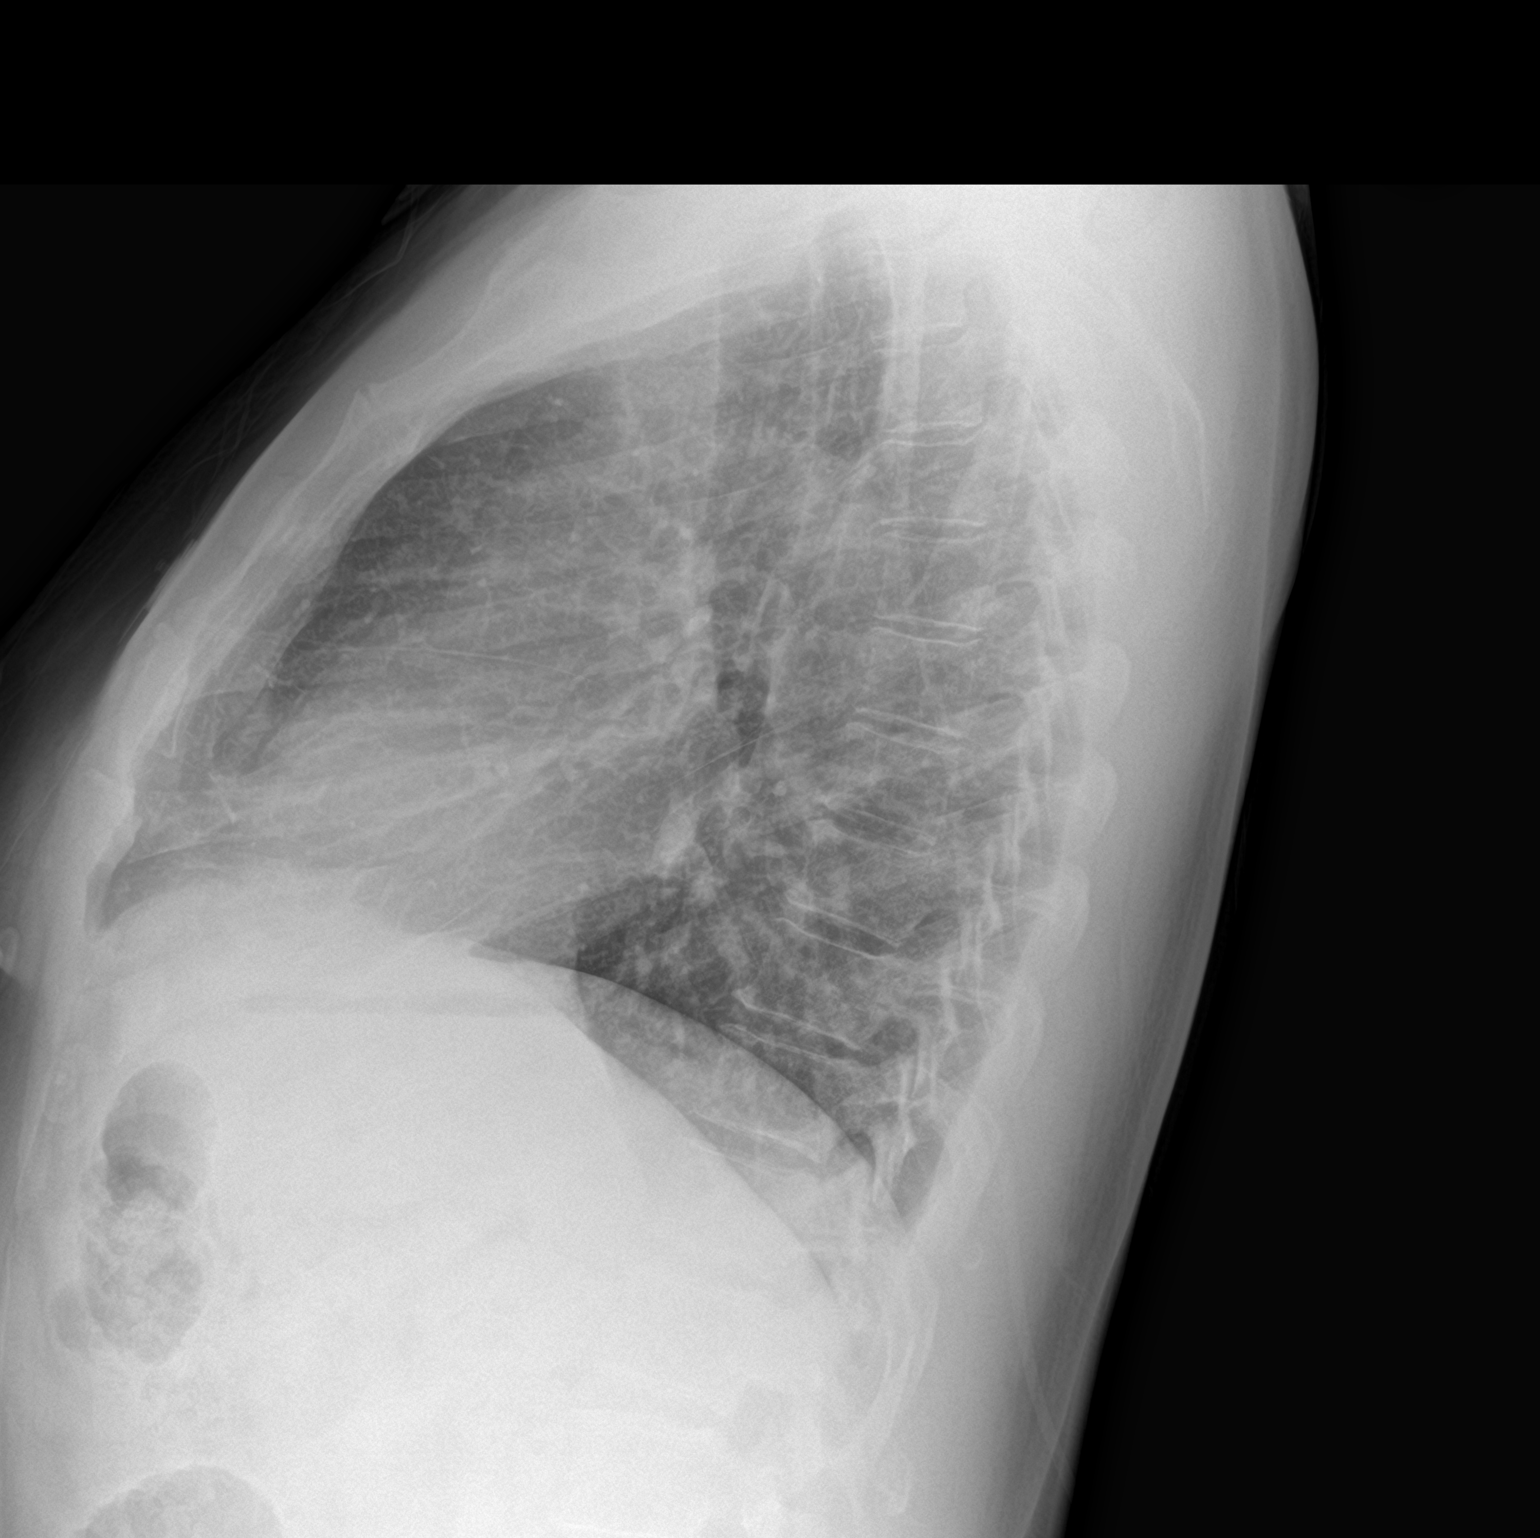

[2 of 2 positions shown; findings below may reference images not displayed]

FINDINGS: The heart size and mediastinal contours are within normal limits.
Both lungs are clear. The visualized skeletal structures are
unremarkable.
IMPRESSION: No active cardiopulmonary disease.

## 2020-06-21 ENCOUNTER — Emergency Department (HOSPITAL_COMMUNITY): Payer: Medicare Other

## 2020-06-21 ENCOUNTER — Emergency Department (HOSPITAL_COMMUNITY)
Admission: EM | Admit: 2020-06-21 | Discharge: 2020-06-21 | Disposition: A | Discharge status: Home / Self Care | Payer: Medicare Other | Attending: Emergency Medicine | Admitting: Emergency Medicine

## 2020-06-21 ENCOUNTER — Encounter (HOSPITAL_COMMUNITY): Payer: Self-pay

## 2020-06-21 DIAGNOSIS — F1210 Cannabis abuse, uncomplicated: Secondary | ICD-10-CM | POA: Insufficient documentation

## 2020-06-21 DIAGNOSIS — R0602 Shortness of breath: Secondary | ICD-10-CM | POA: Diagnosis not present

## 2020-06-21 DIAGNOSIS — R5381 Other malaise: Secondary | ICD-10-CM | POA: Insufficient documentation

## 2020-06-21 DIAGNOSIS — R5383 Other fatigue: Secondary | ICD-10-CM | POA: Insufficient documentation

## 2020-06-21 DIAGNOSIS — Z79899 Other long term (current) drug therapy: Secondary | ICD-10-CM | POA: Diagnosis not present

## 2020-06-21 DIAGNOSIS — F17210 Nicotine dependence, cigarettes, uncomplicated: Secondary | ICD-10-CM | POA: Insufficient documentation

## 2020-06-21 DIAGNOSIS — J449 Chronic obstructive pulmonary disease, unspecified: Secondary | ICD-10-CM | POA: Insufficient documentation

## 2020-06-21 DIAGNOSIS — I10 Essential (primary) hypertension: Secondary | ICD-10-CM | POA: Diagnosis not present

## 2020-06-21 DIAGNOSIS — R519 Headache, unspecified: Secondary | ICD-10-CM | POA: Diagnosis not present

## 2020-06-21 LAB — BASIC METABOLIC PANEL
Anion gap: 8 (ref 5–15)
BUN: 7 mg/dL (ref 6–20)
CO2: 27 mmol/L (ref 22–32)
Calcium: 8.7 mg/dL — ABNORMAL LOW (ref 8.9–10.3)
Chloride: 103 mmol/L (ref 98–111)
Creatinine, Ser: 0.99 mg/dL (ref 0.61–1.24)
GFR calc Af Amer: >60 mL/min (ref >60)
GFR calc non Af Amer: >60 mL/min (ref >60)
Glucose, Bld: 118 mg/dL — ABNORMAL HIGH (ref 70–99)
Potassium: 3.7 mmol/L (ref 3.5–5.1)
Sodium: 138 mmol/L (ref 135–145)

## 2020-06-21 LAB — CBC
HCT: 40.7 % (ref 39.0–52.0)
Hemoglobin: 13.5 g/dL (ref 13.0–17.0)
MCH: 29.2 pg (ref 26.0–34.0)
MCHC: 33.2 g/dL (ref 30.0–36.0)
MCV: 88.1 fL (ref 80.0–100.0)
Platelets: 165 10*3/uL (ref 150–400)
RBC: 4.62 MIL/uL (ref 4.22–5.81)
RDW: 12.2 % (ref 11.5–15.5)
WBC: 10.2 10*3/uL (ref 4.0–10.5)
nRBC: 0 % (ref 0.0–0.2)

## 2020-06-21 NOTE — ED Triage Notes (Signed)
Pt arrives to ED w/ c/o headache that started today at unknown time. Pt reports 10/10 headache. Neuro intact, AOx4. Pt also states he has been feeling sob since he quit smoking cigarettes in 2009.

## 2020-06-21 NOTE — Discharge Instructions (Signed)
Please read and follow all provided instructions.  Your diagnoses today include:  1. Acute nonintractable headache, unspecified headache type   2. Malaise and fatigue     Tests performed today include:  Blood counts and electrolytes - were normal  Chest x-ray - no pneumonia or signs of Covid  EKG - normal  Vital signs. See below for your results today.   Medications prescribed:   None  Take any prescribed medications only as directed.  Home care instructions:  Follow any educational materials contained in this packet.  BE VERY CAREFUL not to take multiple medicines containing Tylenol (also called acetaminophen). Doing so can lead to an overdose which can damage your liver and cause liver failure and possibly death.   Follow-up instructions: Please follow-up with your primary care provider in the next 3 days for further evaluation of your symptoms.   Return instructions:   Please return to the Emergency Department if you experience worsening symptoms.   Please return if you have any other emergent concerns.  Additional Information:  Your vital signs today were: BP 119/87 (BP Location: Right Arm)   Pulse 60   Temp 97.8 F (36.6 C) (Oral)   Resp 18   Ht 6' (1.829 m)   SpO2 100%   BMI 29.43 kg/m  If your blood pressure (BP) was elevated above 135/85 this visit, please have this repeated by your doctor within one month. --------------

## 2020-06-21 NOTE — ED Provider Notes (Signed)
[facility]   CSN: [id number] Arrival date & time: 06/21/20  1438     History Chief Complaint  Patient presents with  . Migraine    Gerald Jackson is a 44 y.o. male.  Patient with history of paranoid schizophrenia and schizoaffective disorder presents to the emergency department with multiple complaints.  Complaint in triage was headache.  Patient states that he took Tylenol and that his headache is now gone.  Headache is generalized.  Denies head injury, nausea or vomiting.  Patient also states that he has been feeling unwell, for at least several days.  He is unable to tell me when this started.  He describes having difficulty breathing and feeling short of breath.  Denies chest pain.  He has not had any nausea, vomiting, or diarrhea today.  Denies urinary symptoms.  States that he missed 2 doses of his medications recently.  He is concerned that he may have Covid.  He states that he received the Covid vaccine.  The onset of this condition was acute. The course is constant. Aggravating factors: none. Alleviating factors: none.           Past Medical History:  Diagnosis Date  . Allergic rhinitis 09/27/2019  . Constipation, chronic 09/27/2019  . COPD (chronic obstructive pulmonary disease) (HCC)   . Depression   . Gout 11/12/2018  . Hiatal hernia 09/27/2019  . Hyperlipidemia 11/12/2018  . Hypertension 11/12/2018  . 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 Paranoid schizophrenia (HCC) 09/27/2019  . Schizoaffective disorder, depressive type (HCC) 09/27/2019    Patient Active Problem List   Diagnosis Date Noted  . Allergic rhinitis 09/27/2019  . Gastroesophageal reflux disease 09/27/2019  . Ingrown nail 09/27/2019  . 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 Paranoid schizophrenia (HCC) 09/27/2019  . Tinea pedis 09/27/2019  . Xerosis cutis 09/27/2019  . Schizoaffective disorder, depressive  type (HCC) 09/27/2019  . Peripheral vascular disease (HCC) 09/27/2019  . Pain in right foot 09/27/2019  . Pain in limb 09/27/2019  . Constipation, chronic 09/27/2019  . History of small bowel obstruction 09/27/2019  . Marijuana use, episodic 09/27/2019  . Alcohol use 09/27/2019  . Hiatal hernia 09/27/2019  . SBO (small bowel obstruction) (HCC) 11/12/2018  . Hyperlipidemia 11/12/2018  . COPD (chronic obstructive pulmonary disease) (HCC) 11/12/2018  . Hypertension 11/12/2018  . Gout 11/12/2018    Past Surgical History:  Procedure Laterality Date  . APPENDECTOMY         No family history on file.  Social History   Tobacco Use  . Smoking status: Current Some Day Smoker    Packs/day: 0.50    Types: Cigarettes  . Smokeless tobacco: Never Used  Substance Use Topics  . Alcohol use: Yes    Comment: occasionally  . Drug use: Yes    Types: Marijuana    Home Medications Prior to Admission medications   Medication Sig Start Date End Date Taking? Authorizing Provider  acetaminophen (TYLENOL) 325 MG tablet Take 650 mg by mouth every 4 (four) hours as needed for mild pain or headache.    [provider]  allopurinol (ZYLOPRIM) 100 MG tablet Take 100 mg by mouth 2 (two) times daily. 10/18/18   [provider]  alum & mag hydroxide-simeth (GERI-LANTA) 200-200-20 MG/5ML suspension Take 30 mLs by mouth every 6 (six) hours as needed for indigestion or heartburn.    [provider]  amLODipine (NORVASC) 10 MG tablet Take 10  mg by mouth daily. 10/18/18   [provider]  atorvastatin (LIPITOR) 20 MG tablet Take 20 mg by mouth at bedtime.  10/18/18   [provider]  benztropine (COGENTIN) 1 MG tablet Take 1.5 mg by mouth 2 (two) times daily.     [provider]  buPROPion (WELLBUTRIN XL) 150 MG 24 hr tablet Take 150 mg by mouth daily. 10/18/18   [provider]  guaiFENesin (ROBITUSSIN) 100 MG/5ML liquid Take 200 mg by mouth every 6  (six) hours as needed for cough.    [provider]  hydrocortisone cream 1 % Apply 1 application topically 3 (three) times daily as needed for itching (minor skin irritation). 09/29/19   Rhetta Mura, MD  ibuprofen (ADVIL,MOTRIN) 200 MG tablet Take 400 mg by mouth every 6 (six) hours as needed for mild pain.    [provider]  lisinopril (PRINIVIL,ZESTRIL) 5 MG tablet Take 5 mg by mouth daily. 10/18/18   [provider]  loxapine (LOXITANE) 10 MG capsule Take 20 mg by mouth at bedtime. 09/23/18   [provider]  omeprazole (PRILOSEC) 20 MG capsule Take 20 mg by mouth daily. 10/18/18   [provider]  Paliperidone Palmitate (INVEGA TRINZA) 819 MG/2.625ML SUSP Inject 1 Applicatorful into the muscle every 3 (three) months.    [provider]  polyethylene glycol (MIRALAX / GLYCOLAX) packet Take 17 g by mouth daily. 11/14/18   Osvaldo Shipper, MD  sertraline (ZOLOFT) 100 MG tablet Take 150 mg by mouth daily.     [provider]  sodium phosphate (FLEET) 7-19 GM/118ML ENEM Place 1 enema rectally See admin instructions. If no relief from milk of mag and prune juice, give 1 enema rectally as needed for constipation. Contact MD if not relieved. Notify MD if constipation is accompanied by severe abdominal pain    [provider]  traZODone (DESYREL) 150 MG tablet Take 150 mg by mouth at bedtime.    [provider]    Allergies    Patient has no known allergies.  Review of Systems   Review of Systems  Constitutional: Positive for fatigue. Negative for fever.  HENT: Negative for rhinorrhea and sore throat.   Eyes: Negative for redness.  Respiratory: Positive for shortness of breath. Negative for cough.   Cardiovascular: Negative for chest pain.  Gastrointestinal: Negative for abdominal pain, diarrhea, nausea and vomiting.  Genitourinary: Negative for dysuria.  Musculoskeletal: Negative for myalgias and neck pain.    Skin: Negative for rash.  Neurological: Positive for headaches.    Physical Exam Updated Vital Signs BP 122/88 (BP Location: Right Arm)   Pulse 61   Temp 97.8 F (36.6 C) (Oral)   Resp 17   Ht 6' (1.829 m)   SpO2 100%   BMI 29.43 kg/m   Physical Exam Vitals and nursing note reviewed.  Constitutional:      Appearance: He is well-developed.  HENT:     Head: Normocephalic and atraumatic.     Right Ear: Tympanic membrane, ear canal and external ear normal.     Left Ear: Tympanic membrane, ear canal and external ear normal.     Nose: Nose normal.     Mouth/Throat:     Pharynx: Uvula midline.  Eyes:     General: Lids are normal.        Right eye: No discharge.        Left eye: No discharge.     Conjunctiva/sclera: Conjunctivae normal.  Pupils: Pupils are equal, round, and reactive to light.  Cardiovascular:     Rate and Rhythm: Normal rate and regular rhythm.     Heart sounds: Normal heart sounds.  Pulmonary:     Effort: Pulmonary effort is normal.     Breath sounds: Normal breath sounds.  Abdominal:     Palpations: Abdomen is soft.     Tenderness: There is no abdominal tenderness. There is no guarding or rebound.  Musculoskeletal:        General: Normal range of motion.     Cervical back: Normal range of motion and neck supple. No tenderness or bony tenderness.  Skin:    General: Skin is warm and dry.  Neurological:     Mental Status: He is alert and oriented to person, place, and time.     GCS: GCS eye subscore is 4. GCS verbal subscore is 5. GCS motor subscore is 6.     Cranial Nerves: No cranial nerve deficit.     Sensory: No sensory deficit.     Motor: No abnormal muscle tone.     Coordination: Coordination normal.     Gait: Gait normal.     Deep Tendon Reflexes: Reflexes are normal and symmetric.     ED Results / Procedures / Treatments   Labs (all labs ordered are listed, but only abnormal results are displayed) Labs Reviewed  BASIC METABOLIC PANEL  - Abnormal; Notable for the following components:      Result Value   Glucose, Bld 118 (*)    Calcium 8.7 (*)    All other components within normal limits  CBC    ED ECG REPORT   Date: 06/21/2020  Rate: 59  Rhythm: sinus bradycardia  QRS Axis: normal  Intervals: normal  ST/T Wave abnormalities: normal  Conduction Disutrbances:none  Narrative Interpretation:   Old EKG Reviewed: unchanged except slower  I have personally reviewed the EKG tracing and agree with the computerized printout as noted.  Radiology DG Chest Port 1 View  Result Date: 06/21/2020 CLINICAL DATA:  Shortness of breath. EXAM: PORTABLE CHEST 1 VIEW COMPARISON:  September 29, 2019 FINDINGS: There is no evidence of acute infiltrate, pleural effusion or pneumothorax. The heart size and mediastinal contours are within normal limits. The visualized skeletal structures are unremarkable. IMPRESSION: No active disease. Electronically Signed   By: Aram Candela M.D.   On: 06/21/2020 18:42    Procedures Procedures (including critical care time)  Medications Ordered in ED Medications - No data to display  ED Course  I have reviewed the triage vital signs and the nursing notes.  Pertinent labs & imaging results that were available during my care of the patient were reviewed by me and considered in my medical decision making (see chart for details).  Patient seen and examined. Patient has multiple vague complaints and gives few details upon questioning.  Reported chief complaint, his headache, is now resolved.  No signs of meningismus or infection.  I do not feel be needs a CT at this time.  He is alert and oriented.  Lab work is reassuring.  Will check checks x-ray and EKG.  Anticipate discharged home if reassuring.     Vital signs reviewed and are as follows: BP 122/88 (BP Location: Right Arm)   Pulse 61   Temp 97.8 F (36.6 C) (Oral)   Resp 17   Ht 6' (1.829 m)   SpO2 100%   BMI 29.43 kg/m   7:32 PM Work-up  reassuring,  discussed with patient.   Requesting something to eat and drink. I provided the patient with a [place] sandwich and Coca[employer]a.  Plan for discharge.  Encouraged primary care follow-up especially if he continues to feel poorly.    MDM Rules/Calculators/A&P                          Patient presents with several complaints today.  Work-up is reassuring.  He had a headache earlier, now improved.  No signs of meningismus or head injury.  Do not feel a head CT is indicated at this time given clinical exam.  Lab work-up is reassuring.  He does report shortness of breath.  He is not hypoxic or tachypneic.  Lungs are clear on exam.  Chest x-ray is normal.  Do not suspect PE.  EKG is unremarkable.  Feel patient is appropriate for discharged home at this time.  He is eating and drinking in room.   Final Clinical Impression(s) / ED Diagnoses Final diagnoses:  Acute nonintractable headache, unspecified headache type  Malaise and fatigue    Rx / DC Orders ED Discharge Orders    None       Renne Crigler, Cordelia Poche 06/21/20 1954    Linwood Dibbles, MD 06/22/20 [phone]
